# Patient Record
Sex: Female | Born: 1970 | Race: White | Hispanic: No | Marital: Single | Smoking: Never smoker
Health system: Southern US, Community
[De-identification: ages and names within clinical notes are randomized; demographics above are authoritative.]

## PROBLEM LIST (undated history)

## (undated) DIAGNOSIS — T7840XA Allergy, unspecified, initial encounter: Secondary | ICD-10-CM

## (undated) DIAGNOSIS — I341 Nonrheumatic mitral (valve) prolapse: Secondary | ICD-10-CM

## (undated) DIAGNOSIS — I471 Supraventricular tachycardia, unspecified: Secondary | ICD-10-CM

## (undated) DIAGNOSIS — D72829 Elevated white blood cell count, unspecified: Secondary | ICD-10-CM

## (undated) DIAGNOSIS — Z8679 Personal history of other diseases of the circulatory system: Secondary | ICD-10-CM

## (undated) DIAGNOSIS — J302 Other seasonal allergic rhinitis: Secondary | ICD-10-CM

## (undated) DIAGNOSIS — R079 Chest pain, unspecified: Secondary | ICD-10-CM

## (undated) DIAGNOSIS — Z87442 Personal history of urinary calculi: Secondary | ICD-10-CM

## (undated) DIAGNOSIS — T783XXA Angioneurotic edema, initial encounter: Secondary | ICD-10-CM

## (undated) DIAGNOSIS — L509 Urticaria, unspecified: Secondary | ICD-10-CM

## (undated) DIAGNOSIS — N201 Calculus of ureter: Secondary | ICD-10-CM

## (undated) DIAGNOSIS — I10 Essential (primary) hypertension: Secondary | ICD-10-CM

## (undated) DIAGNOSIS — G43909 Migraine, unspecified, not intractable, without status migrainosus: Secondary | ICD-10-CM

## (undated) DIAGNOSIS — K7689 Other specified diseases of liver: Secondary | ICD-10-CM

## (undated) HISTORY — PX: PELVIC LAPAROSCOPY: SHX162

## (undated) HISTORY — DX: Elevated white blood cell count, unspecified: D72.829

## (undated) HISTORY — PX: BREAST LUMPECTOMY: SHX2

## (undated) HISTORY — DX: Supraventricular tachycardia, unspecified: I47.10

## (undated) HISTORY — DX: Other specified diseases of liver: K76.89

## (undated) HISTORY — DX: Angioneurotic edema, initial encounter: T78.3XXA

## (undated) HISTORY — DX: Urticaria, unspecified: L50.9

## (undated) HISTORY — DX: Allergy, unspecified, initial encounter: T78.40XA

## (undated) HISTORY — PX: EXCISION OF BREAST BIOPSY: SHX5822

## (undated) HISTORY — DX: Nonrheumatic mitral (valve) prolapse: I34.1

## (undated) HISTORY — PX: WRIST SURGERY: SHX841

## (undated) HISTORY — DX: Supraventricular tachycardia: I47.1

## (undated) HISTORY — DX: Chest pain, unspecified: R07.9

---

## 1998-06-23 ENCOUNTER — Other Ambulatory Visit: Admission: RE | Admit: 1998-06-23 | Discharge: 1998-06-23 | Payer: Self-pay | Admitting: Obstetrics and Gynecology

## 1999-06-24 ENCOUNTER — Other Ambulatory Visit: Admission: RE | Admit: 1999-06-24 | Discharge: 1999-06-24 | Payer: Self-pay | Admitting: Obstetrics and Gynecology

## 2000-07-01 ENCOUNTER — Encounter: Admission: RE | Admit: 2000-07-01 | Discharge: 2000-07-01 | Payer: Self-pay | Admitting: Obstetrics and Gynecology

## 2000-07-21 ENCOUNTER — Ambulatory Visit (HOSPITAL_BASED_OUTPATIENT_CLINIC_OR_DEPARTMENT_OTHER): Admission: RE | Admit: 2000-07-21 | Discharge: 2000-07-21 | Payer: Self-pay | Admitting: Surgery

## 2000-07-21 ENCOUNTER — Encounter (INDEPENDENT_AMBULATORY_CARE_PROVIDER_SITE_OTHER): Payer: Self-pay | Admitting: *Deleted

## 2000-08-01 ENCOUNTER — Other Ambulatory Visit: Admission: RE | Admit: 2000-08-01 | Discharge: 2000-08-01 | Payer: Self-pay | Admitting: Obstetrics and Gynecology

## 2000-09-08 ENCOUNTER — Encounter: Payer: Self-pay | Admitting: Family Medicine

## 2000-09-08 ENCOUNTER — Encounter: Admission: RE | Admit: 2000-09-08 | Discharge: 2000-09-08 | Payer: Self-pay | Admitting: Family Medicine

## 2001-08-21 ENCOUNTER — Other Ambulatory Visit: Admission: RE | Admit: 2001-08-21 | Discharge: 2001-08-21 | Payer: Self-pay | Admitting: Obstetrics and Gynecology

## 2001-11-21 ENCOUNTER — Emergency Department (HOSPITAL_COMMUNITY): Admission: EM | Admit: 2001-11-21 | Discharge: 2001-11-21 | Payer: Self-pay | Admitting: Emergency Medicine

## 2001-11-21 ENCOUNTER — Encounter: Payer: Self-pay | Admitting: Emergency Medicine

## 2002-09-26 ENCOUNTER — Other Ambulatory Visit: Admission: RE | Admit: 2002-09-26 | Discharge: 2002-09-26 | Payer: Self-pay | Admitting: Obstetrics and Gynecology

## 2003-09-30 ENCOUNTER — Other Ambulatory Visit: Admission: RE | Admit: 2003-09-30 | Discharge: 2003-09-30 | Payer: Self-pay | Admitting: Obstetrics and Gynecology

## 2004-06-08 ENCOUNTER — Ambulatory Visit (HOSPITAL_COMMUNITY): Admission: RE | Admit: 2004-06-08 | Discharge: 2004-06-08 | Payer: Self-pay | Admitting: Family Medicine

## 2004-06-11 ENCOUNTER — Inpatient Hospital Stay (HOSPITAL_COMMUNITY): Admission: EM | Admit: 2004-06-11 | Discharge: 2004-06-16 | Payer: Self-pay | Admitting: Internal Medicine

## 2004-09-24 ENCOUNTER — Ambulatory Visit (HOSPITAL_COMMUNITY): Admission: RE | Admit: 2004-09-24 | Discharge: 2004-09-24 | Payer: Self-pay | Admitting: Family Medicine

## 2004-10-14 ENCOUNTER — Other Ambulatory Visit: Admission: RE | Admit: 2004-10-14 | Discharge: 2004-10-14 | Payer: Self-pay | Admitting: Obstetrics and Gynecology

## 2005-04-06 ENCOUNTER — Encounter: Admission: RE | Admit: 2005-04-06 | Discharge: 2005-04-06 | Payer: Self-pay | Admitting: Allergy and Immunology

## 2005-06-04 ENCOUNTER — Emergency Department (HOSPITAL_COMMUNITY): Admission: EM | Admit: 2005-06-04 | Discharge: 2005-06-04 | Payer: Self-pay | Admitting: *Deleted

## 2005-12-02 ENCOUNTER — Other Ambulatory Visit: Admission: RE | Admit: 2005-12-02 | Discharge: 2005-12-02 | Payer: Self-pay | Admitting: Obstetrics and Gynecology

## 2006-08-18 ENCOUNTER — Emergency Department (HOSPITAL_COMMUNITY): Admission: EM | Admit: 2006-08-18 | Discharge: 2006-08-18 | Payer: Self-pay | Admitting: Emergency Medicine

## 2006-09-13 ENCOUNTER — Ambulatory Visit: Payer: Self-pay | Admitting: Cardiovascular Disease

## 2006-09-19 ENCOUNTER — Ambulatory Visit: Payer: Self-pay | Admitting: Cardiovascular Disease

## 2006-10-13 ENCOUNTER — Ambulatory Visit: Payer: Self-pay | Admitting: Internal Medicine

## 2006-10-14 ENCOUNTER — Ambulatory Visit (HOSPITAL_COMMUNITY): Admission: RE | Admit: 2006-10-14 | Discharge: 2006-10-14 | Payer: Self-pay | Admitting: Cardiovascular Disease

## 2006-10-25 ENCOUNTER — Ambulatory Visit: Payer: Self-pay | Admitting: Cardiovascular Disease

## 2006-11-04 ENCOUNTER — Ambulatory Visit: Payer: Self-pay | Admitting: Internal Medicine

## 2007-01-26 ENCOUNTER — Ambulatory Visit: Payer: Self-pay | Admitting: Internal Medicine

## 2007-01-26 LAB — CONVERTED CEMR LAB: TSH: 0.96 microintl units/mL (ref 0.35–5.50)

## 2007-02-02 ENCOUNTER — Ambulatory Visit: Payer: Self-pay | Admitting: Internal Medicine

## 2007-04-19 ENCOUNTER — Encounter: Admission: RE | Admit: 2007-04-19 | Discharge: 2007-05-18 | Payer: Self-pay | Admitting: Family Medicine

## 2007-08-09 ENCOUNTER — Ambulatory Visit: Payer: Self-pay | Admitting: Internal Medicine

## 2008-07-03 ENCOUNTER — Emergency Department (HOSPITAL_BASED_OUTPATIENT_CLINIC_OR_DEPARTMENT_OTHER): Admission: EM | Admit: 2008-07-03 | Discharge: 2008-07-04 | Payer: Self-pay | Admitting: Emergency Medicine

## 2010-10-11 HISTORY — PX: WRIST SURGERY: SHX841

## 2011-02-23 NOTE — Assessment & Plan Note (Signed)
[facility]                         ELECTROPHYSIOLOGY OFFICE NOTE   NAME:[name] [name], [name] [name]                      MRN:          [id number]  DATE:08/09/2007                            DOB:          08/28/1971    [name] [name] returns today for followup.  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  I saw her last back in  April, and at that time she had not been tolerant of Zoloft, and we  stopped that medication.  She had done well through the spring and  summer, and actually had been out west with her family hiking for weeks  at a time in [place] in the mountains there.  The patient, however,  on returning back to [place], did notice some difficulty with  dizziness and lightheadedness, and she thought the humidity may have  been part of her problem.  She has subsequently gone back to teaching  school, and has gone to exercising 5 days a week with a trainer.  She  states that she still occasionally gets dizzy, and notes that her heart  rate increases to 50 beats per minute during exercise with a treadmill.  She also notes some relative hypotension with blood pressures in the 120  range during exercise.  Otherwise, she feels well, and is generally  better.   EXAMINATION:  She is a pleasant well-appearing young woman in no acute  distress.  The blood pressure today was 120/90.  The pulse was 81 and regular.  Respirations were 18.  The weight was 192 pounds.  NECK:  Revealed no jugular venous distention.  LUNGS:  Clear bilaterally to auscultation.  No wheezes, rales, or  rhonchi were present.  CARDIOVASCULAR EXAM:  Revealed a regular rate and rhythm and normal S1  and S2.  EXTREMITIES:  Demonstrated no edema.   MEDICATIONS:  Include Zyrtec and oral contraceptive pills.   IMPRESSION:  1. History of autonomic dysfunction with syncope, now mostly  resolved.  2. History of palpitations.   DISCUSSION:  Overall, [name] [name] is stable.  She is maintaining  adequate blood pressure with increased fluid and sodium diet.  We will  plan to see her back in the office in 1 year, sooner if additional  problems.     Doylene Canning. Ladona Ridgel, MD  Electronically Signed    GWT/MedQ  DD: 08/09/2007  DT: 08/10/2007  Job #: [id number]

## 2011-02-26 NOTE — Consult Note (Signed)
NAME:  [name] [name], [name] [name]                         ACCOUNT NO.:  [id number]   MEDICAL RECORD NO.:  [id number]                   PATIENT TYPE:  INP   LOCATION:  0359                                 FACILITY:  [facility]   PHYSICIAN:  Danice Goltz, M.D. [facility]            DATE OF BIRTH:  05-Oct-1971   DATE OF CONSULTATION:  06/12/2004  DATE OF DISCHARGE:                                   CONSULTATION   REASON FOR CONSULTATION:  Dyspnea and hypoxia with bilateral pulmonary  infiltrates.   This is a 40 year old white female, nonsmoker, who presented on June 11, 2004, to the emergency room via her primary physician's office because of  dyspnea and hypoxemia. The patient was noted to have bilateral infiltrates.  She had been treated as an outpatient for presumed pneumonia with Levaquin  of which she had had a total of three days. She was subsequently admitted  and a CT scan of the chest showed no pulmonary embolism. However, she had  bilateral infiltrates in a nodular sort of pattern. The infiltrates appear  mostly inflammatory appear mostly inflammatory or consistent with an  alveolitis. The patient does report fevers and chills, temperature up to 101  on several occasions prior to her admission. This was approximately three to  four days prior to her admission. She has had a dry nonproductive cough and  on one occasion was to bring up some mildly yellow sputum, but for the most  part has been nonproductive. She states that she was noted to be tachycardic  at her primary care physician's office where an EKG was done and was  reported normal with the exception of a fast rate. The patient states that  she feels fine as long as she is wearing her oxygen. She appears clearly  dyspneic, though on examination. She denies any chest pain. She has had no  GI symptoms. She denies any arthralgias, myalgias, or any constitutional  symptoms. She denies any recent weight loss and has had no anorexia. There  has been no genitourinary symptoms.   PAST MEDICAL HISTORY:  1.  Endometriosis for which she is on birth control pills.  2.  Seasonal allergic rhinitis for which she takes Zyrtec.   MEDICATIONS:  As noted on the [facility] and currently include Rocephin and  Zithromax.   SOCIAL HISTORY:  She denies cigarette use. She does not imbibe alcohol to  excess. She denies any illicit drug use or HIV risk factors. 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   FAMILY HISTORY:  Noncontributory.   REVIEW OF SYSTEMS:  As noted above and taken extensively, and all parameters  are negative with the exception of her respiratory symptoms as noted above.   PHYSICAL EXAMINATION:  VITAL SIGNS: Blood pressure 115/60, heart rate 101,  respirations 20 at the time of the evaluation, and temperature 99.4.  GENERAL: This is a tall, slender white female who does  not appear to be in  any acute respiratory distress; however she can be noted to occasionally  appear somewhat dyspneic particularly while speaking.  HEENT: Some mild dryness of the oral mucosa. Pharynx is clear without  erythema.  NECK: Supple. Her cervical lymph chain is without swelling or tenderness.  Thyroid is palpable, but within normal limits. Trachea is midline and  nontender.  LUNGS: She has diminished breath sounds at the bases with crackles and pops  heard. She has occasional end-expiratory localized wheeze at the bases.  CARDIAC: Tachycardic, regular rate. No rubs, murmurs, or gallops heard.  ABDOMEN: Soft and nontender. No hepatosplenomegaly noted. No bruits.  Normoactive bowel sounds present.  GU/RECTAL EXAM: Deferred as not pertaining to this acute problem.  EXTREMITIES: The patient has no clubbing, cyanosis, or edema noted.   I did review the patient's CT scan and chest x-ray. She has diffuse  bilateral infiltrates mostly at the bases with extension into the mid lung  fields. She has some perihilar nodular type appearing  infiltrates, and the  appearance of alveolitis on CT scan.   Arterial blood gases show a pH of 7.47, PCO2 35, PAO2 64, this was on room  air. Oxygen saturation at that time was 93%.  White blood cell count 8.4,  H&H 14.1 and 41.3 with a platelet count of 264,000. Electrolytes reveal  sodium of 135, potassium 3.9, chloride 99, bicarbonate 28, BUN 8, creatinine  0.8, glucose 122.   IMPRESSION:  1.  Hypoxemic respiratory failure due to bilateral pulmonary infiltrates of      which a differential is vast.  Differential includes infectious be it      typical or atypical pneumonia versus inflammatory such as acute      alveolitis associated with any of  the myriad of inflammatory conditions      to include connective tissue disease induced pulmonary infiltrates and      potentially also bronchiolitis obliterans organizing pneumonia.  2.  History of endometriosis.  3.  Tachycardia. Query tidal volume depletion.   PLAN:  1.  I would recommend the patient have her IV fluids increase until her p.o.      intake improves.  2.  Continue Rocephin and azithromycin as you are now doing.  3.  Sed rate has already been obtained. Will add LDH, ANA, and rheumatoid      factor to those laboratories as well as TSH and T4 given the patient's      tachycardia.  4.  Obtain sputum for culture and sensitivity, and pneumocystis carina      stain.  5.  For mucociliary clearance, will add low dose Xopenex via nebulizer      followed by an acapella flutter valve to see if this will help her      mobilize secretions.  6.  Because she does have evidence of inflammation with mild bronchospasm      noted on chest examination, I will start empiric IV steroids as she is      covered with IV antibiotics at present.  7.  For completeness will obtain a urine/drug screen though the patient      appears to be very reliable in her history of no drug use; however, must     consider inflammatory lung disease due to illicit  drug use.  8.  Further recommendations pending results of the above. I did discuss with      the patient potential need for bronchoscopy if she fails to improve.  Thank you very much for this consultation.  I will follow along with you  expectantly.                                               Danice Goltz, M.D. [facility]    LG/MEDQ  D:  06/12/2004  T:  06/13/2004  Job:  [id number]

## 2011-02-26 NOTE — Discharge Summary (Signed)
NAME:  NEAL, ROMANCHUK                         ACCOUNT NO.:  [id number]   MEDICAL RECORD NO.:  [id number]                   PATIENT TYPE:  INP   LOCATION:  0359                                 [facility]ACILITY[facility]  [facility]   PHYSICIAN:  Hollice Espy, M.D.            DATE OF BIRTH:  1970-12-16   DATE OF ADMISSION:  06/11/2004  DATE OF DISCHARGE:  06/16/2004                                 DISCHARGE SUMMARY   CONSULTATIONS:  Charlaine Dalton. Sherene Sires, M.D. [facility], pulmonary   PRIMARY CARE PHYSICIAN:  Meredith Staggers, M.D.   DISCHARGE DIAGNOSIS:  1.  Pneumonitis of unspecified etiology.  2.  Endometriosis.  3.  History of seasonal rhinitis.   DISCHARGE MEDICATIONS:  Ketek 400 mg 1 p.o. q.24h. x 4 days, prednisone  taper 40 mg p.o. b.i.d. to continue for decreasing by 10 mg every two days  until a complete ten day course has been done.  The patient will also  continue her previous medications of oral contraceptives and Claritin for  allergies.   HOSPITAL COURSE:  The patient is a 39 year old white female with past  medical history of endometriosis and seasonal rhinitis who presented to the  emergency room  on June 11, 2004, complaining of dyspnea and  polycythemia.  She was noted to have bilateral infiltrates on chest x-ray in  the PCPs office and she had been treated for an outpatient pneumonia and was  admitted under the diagnosis of atypical pneumonia without much improvement  in the outpatient setting.  There was some concern because on ABG on  admission, the patient had noted hypoxemia with a pO2 in the 60s.  A D-dimer  was ordered which was elevated and there was some concerns she may have a  pulmonary embolus.  A CT scan of the chest was ordered on the evening of her  admission which showed no evidence of any pulmonary embolism, however, she  was noted to have bilateral infiltrates in a nodular pattern mostly  inflammatory, possibly an alveolitis.  Given that this may represent  interstitial lung disease, pulmonary care medicine was consulted.  Dr.  Jayme Cloud from the pulmonary group saw the patient and felt that a hypoxemic  respiratory failure with bilateral pulmonary infiltrates has a large  differential diagnoses including infectious versus inflammatory, and  possibly even bronchiolitis obliterans or organizing pneumonia.  The patient  was recommended to continue on her antibiotic.  The sed rate was obtained  which was found to be normal and LDH, ANA, and rheumatoid factor were  ordered, as well.  Empiric steroids were added to her IV antibiotics.  By  June 13, 2004, the patient's chest x-ray done in follow up showed some  improvement.  She, herself, started feeling better.  Her O2 sats remained  approximately 90% on room air by June 15, 2004, and her breathing was  much improved.  By June 16, 2004, she was felt to be  much improved and  stable for discharge.  Her breathing was at her baseline.  An O2 sat was  checked on room air and was 98%, she showed no active work of breathing.  It  was felt that an infection was doubtful and the patient likely had an  unspecified pneumonitis.  It was felt that she should continue on her  current treatment of antibiotics for a full ten more days with a tapering  dose of prednisone.  In addition, prophylactically, she would continue on  four more days of antibiotic Ketac.  The plan will be to discharge the  patient to home.  She will follow up with Dr. Sherene Sires in the pulmonary office  on September 12, she is to  call for appointment as well as directions.  She will, additionally, follow  up with her PCP, Dr. Dayton Scrape, in the next 2-3 weeks.  Her diet is regular.  Activities are as tolerated.  She may return to work only after following up  with Dr. Sherene Sires.  She is, otherwise, being discharged to home and her  disposition is improved.                                                Hollice Espy, M.D.     SKK/MEDQ  D:  06/16/2004  T:  06/16/2004  Job:  [id number]   cc:   Charlaine Dalton. Sherene Sires, M.D. [facility]   Meredith Staggers, M.D.  [address]  [place]  [place] [address]  Fax: [phone]

## 2011-02-26 NOTE — Assessment & Plan Note (Signed)
[facility]                         ELECTROPHYSIOLOGY OFFICE NOTE   NAME:Phillippi, DERRICK EPPERS                      MRN:          [id number]  DATE:11/04/2006                            DOB:          26-Feb-1971    Ms. Medeiros is referred today by Dr. Charlton Haws for evaluation of  possible autonomic dysfunction.   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  The patient was noted to have a history of a very  trace amount of mitral regurgitation and preserved LV function by echo  in 2005.  She subsequently wore a cardiac monitor which demonstrated  heart rates up to 140 beats per minute thought to be secondary to sinus  tachycardia and this heart racing was associated with dyspnea.  She  underwent cardiopulmonary stress testing where her heart rate increased  up to 146 beats per minute despite a very small (2.5 a minute) work  load.  Her blood pressure response dropped during exercise manifesting a  very abnormal blood pressure response.  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  The patient was subsequently given a  prescription for Zoloft 25 mg daily and is referred today for additional  evaluation.  She denies any frank syncope.  She does have occasional  dizzy spells and when she does, she sits down or lies down and the  episodes typically pass without other sequelae.  She denies chest pain  or shortness of breath.  She denies nausea, vomiting, diarrhea or  constipation.  She has no difficulty swallowing.   PAST MEDICAL HISTORY:  Notable for a lumpectomy in the past.  She has a  history of laparoscopy in the past, as well.   SOCIAL HISTORY:  The patient is single and teaches third grade.  She  denies tobacco and ethanol abuse.  She notes that she exercises several  times per week but not in a strenuous manner.   She drinks less than one  caffeinated beverage a day.   MEDICATIONS:  Oral contraceptives and Zyrtec p.r.n.   REVIEW OF SYSTEMS:  Otherwise negative except as noted in the HPI.   PHYSICAL EXAMINATION:  GENERAL:  Pleasant, well appearing young woman looking somewhat younger  than her stated age.  VITAL SIGNS:  Blood pressure 131/85 in the supine position and 86 was  her heart rate.  Standing, blood pressure actually increased to 155/88  and her heart rate increased to 103 beats per minute.  There was no  significant change at standing at five minutes.  Respirations were 18.  Weight 190 pounds.  HEENT:  Normocephalic, atraumatic, pupils equal and round, oropharynx  moist, sclerae anicteric.  NECK:  No jugular venous distention. There is no thyromegaly.  The  trachea was midline. The carotids are 2+ and symmetric.  LUNGS:  Clear to auscultation bilaterally.  No wheezes, rales, or  rhonchi.  HEART:  Regular rate and rhythm with normal S1 and S2.  There are no  murmurs, gallops, and  rubs.  The PMI is not enlarged nor laterally  displaced.  ABDOMEN:  Soft, nontender, nondistended.  There is no organomegaly.  EXTREMITIES:  No cyanosis, clubbing, or edema. Pulses were 2+ and  symmetric.  NEUROLOGICAL:  Alert and oriented x3, cranial nerves intact, strength  5/5 and symmetric.   Her EKG demonstrates sinus rhythm with normal axis and intervals.   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   DISCUSSION:  The patient's symptoms are fairly mild, though she does  have some exercise intolerance.  She has had no frank syncope.  I have  recommended that she continue on her Zoloft and increase the dose of 50  mg daily as this is our typical low therapeutic dose.  I plan to see her  back in approximately 6-8 weeks to see how she is doing.  I have talked  to her about the importance of avoiding caffeinated beverages and of  getting plenty of rest and avoiding other stimulants.  Also,  avoiding  alcoholic beverages has been discussed.  She has been instructed to  increase her salt and fluid intake.  I have asked that she continue to  exercise on a regular basis.     Doylene Canning. Ladona Ridgel, MD  Electronically Signed    GWT/MedQ  DD: 11/04/2006  DT: 11/05/2006  Job #: [id number]   cc:   Noralyn Pick. Eden Emms, MD, Raliegh Scarlet, MD

## 2011-02-26 NOTE — Assessment & Plan Note (Signed)
[facility]                         ELECTROPHYSIOLOGY OFFICE NOTE   NAME:[name], [name] FLAIM                      MRN:          [id number]  DATE:02/02/2007                            DOB:          [birth year]    HISTORY OF PRESENT ILLNESS:  [name] [name] returns today for followup. She  is a very pleasant young woman with a history of presumed autonomic  dysfunction, which in retrospect, I suspect is more likely related to  deconditioning, who returns today for followup. I saw her back in  January and at that time, she had been placed on low dose Zoloft by Dr.  Eden Emms. Because of her very small dose, we actually increased it to 50  mg daily. She notes that she has not done particularly better. She has  some trouble sleeping and does have some bazaar dreams. Her activity is  fairly sedentary. She exercises 3 days a week at [employer] but does not  note any appreciable improvement in her exercise tolerance or in her  fatigue. The patient has otherwise been fairly stable. 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   MEDICATIONS:  Include Zoloft 50 daily, Zyrtec, and oral contraceptive  agents.   PHYSICAL EXAMINATION:  GENERAL:  A pleasant well appearing woman in no  distress.  VITAL SIGNS:  Blood pressure 120/78, pulse 92 and regular. Respiratory  rate 18. Weight 188 pounds.  NECK:  No jugular venous distention.  LUNGS:  Clear to auscultation bilaterally. No wheezes, rales, or rhonchi  are present.  CARDIOVASCULAR:  Regular rate and rhythm. Normal S1 and S2.  EXTREMITIES:  No edema.   IMPRESSION:  1. Questionable autonomic dysfunction versus deconditioning.  2. Palpitations.   DISCUSSION:  [name] [name] is otherwise stable. Because she has had no  improvement on the Zoloft, I have asked that she wean herself off of  this by taking 25 mg daily for 2 weeks, then stopping it altogether. I  have asked that she increase her exercise  regimen by walking or doing  some aerobic exercise, at least 30 minutes every day. I would like to  see her back one time near the end of the summer, in the middle of  August. At that point, if she is stable, then I may allow her to  followup with her primary care physician.     Doylene Canning. Ladona Ridgel, MD  Electronically Signed    GWT/MedQ  DD: 02/02/2007  DT: 02/02/2007  Job #: [id number]

## 2011-02-26 NOTE — Assessment & Plan Note (Signed)
[facility]                            CARDIOLOGY OFFICE NOTE   NAME:[name] [name]                      MRN:          [id number]  DATE:10/25/2006                            DOB:          January 29, 1971    [name] [name] returns today for followup.  I initially saw her on September 13, 2006.  At that time, she was referred for exertional dyspnea,  palpitations and shortness of breath.   In talking to the patient, she did not appear to have a significant  structural heart problem.  She had an echo back in 2005, which was  essentially normal with trace MR.   However, I was somewhat concerned by the patient's autonomic function.  She appeared to have excessive tachycardia with exercise and  lightheadedness that may have represented POT syndrome.   The patient had an event monitor which showed sinus tachycardia with  rates as high as 138 with symptoms of dyspnea.   We scheduled her for a CPX study.  Again with CPX testing, she had  inappropriately high heart rates at low work loads.  This included a  heart rate of 146 at 2.5 met level.   She also had an abnormal blood pressure response; at one point during  exercise, her blood pressure peaked at 175 systolic and then dropped to  657 with symptoms of lightheadedness.   Overall, I think the clinical scenario is fairly classic for POT  syndrome.   I had a long discussion with the patient regarding the diagnosis of  dysautonomia.   She sweats normally and would not appear to have any other difficulties,  although it was interesting that she also appears to have some mild  immunodeficiency that has led to some pulmonary infections in the past.  I told her I would like to refer her to Dr. Graciela Husbands who has a bit of  expertise in this area, and he has also referred some people to Dr.  Berneice Gandy in [place] for further testing.  I gave her initially 2 treatment  options including SSRIs and midodrine.  We both agreed that at a  young  age taking a t.i.d. drug was somewhat difficult.  We will start her on  Zoloft 25 mg a day.  She will followup with Dr. Graciela Husbands to see if any  other testing or medication trials are indicated.   I tried to reassure her that this was not a dangerous condition, but I  suspect it does clinically fit with her symptoms that she has had for  quite some time.   PHYSICAL EXAMINATION:  VITAL SIGNS:  The pulse is 88 and regular, blood  pressure is 120/70 sitting (it actually rises to 140/75 on standing at  rest).  NECK:  There is no lymphadenopathy, no thyromegaly.  Carotids are  normal.  HEART:  There is an S1 and S2 with normal heart sounds.  ABDOMEN:  Benign.  LOWER EXTREMITIES:  Intact pulses, no edema, no varicosities.   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  Start Zoloft 25 mg a day.  The  patient tends to be very  sensitive to any medication.  When she sees Dr.  Graciela Husbands in followup, they can make a decision about whether or not to  increase her Zoloft to 50 mg a day.   She does not want to take a t.i.d. drug such a Proamatine.   We talked about liberalizing salt and water intake.   I am anxious to see if Dr. Graciela Husbands has any other thoughts on her clinical  condition.   I think that the event monitoring and CPX has given [place] an answer in  regards to her inappropriate tachycardia and drop in blood pressure with  exercise.     Noralyn Pick. Eden Emms, MD, [facility]  Electronically Signed    PCN/MedQ  DD: 10/25/2006  DT: 10/26/2006  Job #: [id number]

## 2011-02-26 NOTE — Op Note (Signed)
[place]  Patient:    [name] [name], [name] [name]                        MRN: [id number] Proc. Date: 07/21/00 Adm. Date:  [id number] Attending:  Andre Lefort CC:         Arvella Merles, M.D.  Keane Scrape, M.D.   Operative Report  DATE OF BIRTH:  [birth year]  PREOPERATIVE DIAGNOSIS:  Mass right breast in a ______ position.  POSTOPERATIVE DIAGNOSIS:  Mass right breast in a ______ position.  PROCEDURE:  Right breast biopsy.  SURGEON:  Dr. Ezzard Standing.  ASSISTANT:  None.  ANESTHESIA:  MAC anesthesia with about 20 cc. of 1% Xylocaine.  COMPLICATIONS:  None.  INDICATIONS FOR PROCEDURE:  Ms. 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  The patient now comes for excision of this mass.  The patient was placed in the supine position.  ______ MAC anesthesia right breast was prepped with Betadine solution and sterilely draped.  An incision was made over directly over the palpable mass in her right breast excising an approximate 2 cm mass which appeared to be a fibroadenoma and this was sent for permanent pathology.  The wound was irrigated and closed with 3-0 Vicryl suture and the skin with 5-0 Vicryl suture, painted with tincture of benzoin and Steri-Strips and sterilely dressed.  The patient tolerated the procedure well and was transferred to the recovery room in good condition.  She will see me back in my office in a week for wound check. DD:  07/21/00 TD:  07/22/00 Job: [id number]

## 2011-02-26 NOTE — Assessment & Plan Note (Signed)
[facility]                            CARDIOLOGY OFFICE NOTE   NAME:[name] [name]                      MRN:          [id number]  DATE:09/13/2006                            DOB:          [birth year]    [name] is a 40 year old patient referred for exertional dyspnea,  palpitations, and shortness of breath.   The patient had an episode on November 8, where she suddenly felt short  of breath.  It hurt her to take a deep breath.  She was seen in the  office and found to be tachycardic in sinus at a heart rate of 140.  She  was sent to [facility] and had a normal CT scan.  There was no evidence of PE or interstitial lung disease.   In talking to the patient, she felt that she has had exaggerated heart  rate responses under different stresses.  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  She was admitted to [facility] under the pulmonary service for pneumonitis of unspecified  etiology.  She has a history of presumed asthma with seasonal rhinitis.   She has not had recent follow-up with our lung doctors.   When she was seen at [facility], she had a TSH and T4 which were normal and  her lab work was otherwise unremarkable.  The patient denies excessive  caffeine or drug use.  She has not been diagnosed with anxiety attacks  before.  The patient has had an echo here in our office in October of  2005 which was essentially normal.  There was actually no evidence of  MVP and I actually read the study.   REVIEW OF SYSTEMS:  Remarkable for no productive sputum, no recent  fever, no swelling in the legs, and no chest pain.   FAMILY HISTORY:  Unremarkable.  She has a brother and sister without  significant problems.  She has a grandmother who is morbidly obese and  short of stature who has had heart failure.   SOCIAL  HISTORY:  She is a third grade teacher.  She is not married.  She  enjoys traveling and doing mission trips.   She has had a previous lumpectomy and laparoscopy.  She has no  significant allergies.  She tries to walk on a regular basis and goes to  [employer] three times per week.   MEDICATIONS:  Her only medications include Zyrtec and birth control  pills.   PHYSICAL EXAMINATION:  HEENT:  Normal.  There is no thyromegaly, no  lymphadenopathy, and no carotid bruits.  LUNGS:  Clear.  There is no evidence of wheezing.  HEART:  There is an S1 and S2 with normal heart sounds.  There is no  murmur, rub, gallop, or click.  ABDOMEN:  Benign.  EXTREMITIES:  Lower extremities intact pulses and no edema.  NEUROLOGY:  Nonfocal.  SKIN:  Warm and dry.   EKG is in the chart from [facility] showed essentially normal rhythm with no  arrhythmia and no evidence of congenital abnormalities and no conduction  delays.   IMPRESSION:  The patient would clinically sound like she was having  anxiety attacks.  However, this does not seem to fit her personality.  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  I am  not sure if an alpha trypsin level has been checked, but this may be  worthwhile.   The patient does not need a follow-up echo, hers was normal in 2005 and  she has no abnormal murmurs.   We will give her an event monitor for a month to see if we can capture  any abnormal atrial arrhythmias.  I would also like her to have a CPX  study to see what her exercise limitations are, either cardiac or  pulmonary.  We will see her after this to further investigate her  symptoms, but I suspect that she just has a tendency to have somewhat of  an excessive heart rate with any type of stimulation.   Further recommendations will be based on the results of her CPX and  event monitor.     Noralyn Pick. Eden Emms, MD, [facility]  Electronically Signed    PCN/MedQ  DD: 09/13/2006  DT: 09/14/2006  Job  #: [id number]   cc:   Deatra James, M.D.

## 2011-02-26 NOTE — H&P (Signed)
NAME:  [name] [name], [name] [name]                         ACCOUNT NO.:  [id number]   MEDICAL RECORD NO.:  [id number]                   PATIENT TYPE:  INP   LOCATION:  0359                                 FACILITY:  [facility]   PHYSICIAN:  Hollice Espy, M.D.            DATE OF BIRTH:  20-Jun-1971   DATE OF ADMISSION:  06/11/2004  DATE OF DISCHARGE:                                HISTORY & PHYSICAL   PRIMARY CARE PHYSICIAN:  Dr. Arsenio Loader   CHIEF COMPLAINT:  Shortness of breath.   The patient is a 40 year old white female with past medical history of  endometriosis, who presents for 5 days of increasing shortness of breath.  The patient reportedly has been previously well with no complaints.  She has  not had any previous problems with asthma or pneumonia and starting on  Saturday, she was complaining of some slightly increased shortness of  breath.  Since then, she has noticed that she is having more and more  dyspnea on exertion and occasional cough which has been productive.  She  described the sputum as mostly yellowish.  Her symptoms continued to worsen,  and she initially saw her PCP early in the week and since then has noted  that on a follow up visit today, her O2 saturations were down from 98 to  94%.  She is having much more dyspnea on exertion.  A chest x-ray done  showed bilateral lower lobe pneumonia, and the concern was that patient was  having extensive pneumonia that was not doing well on outpatient antibiotic  therapy.  After discussion with [facility] hospitalist, the patient was  transferred to [facility] as a direct admission.  Currently, she states she  is feeling okay, although she can feel that she is still short of breath.  She denies any chest pain, headaches, visual changes, palpitations,  dysphagia, abdominal pain, wheezing, hematuria, dysuria, constipation, or  diarrhea.   PAST MEDICAL HISTORY:  Endometriosis with seasonal rhinitis.   MEDICATIONS:  1.  Seasonale  which is a birth control pill for her endometriosis.  2.  She is also on allergy medications.   SOCIAL HISTORY:  She denies any tobacco, alcohol, or drug use.   FAMILY HISTORY:  Noncontributory.   PHYSICAL EXAMINATION:  VITAL SIGNS ON ADMISSION:  Temperature 98.3, blood  pressure 132/75, respirations 16, heart rate is elevated at 129.  When I  evaluated the patient, she was borderline tachycardic.  O2 saturation 95% on  room air.  GENERAL:  She alert and oriented x 3.  No apparent distress.  HEENT:  Normocephalic, atraumatic.  Mucous membranes are moist.  She has no  carotid bruits.  HEART:  Regular rate and rhythm.  LUNGS:  She has bibasilar crackles, left greater than right.  ABDOMEN:  Soft, nontender, nondistended, positive bowel sounds.  EXTREMITIES:  No cyanosis, clubbing, or edema.   LABORATORY DATA:  CBC, ABG, and BMET are  ordered and are pending.   ASSESSMENT AND PLAN:  Bilateral lower lobe pneumonia.  Given patient is  tachycardic and feeling slightly short of breath, would like to go ahead and  100% rule out a severe pulmonary embolus or other lung disease.  To that, we  will go ahead and check an ABG first.  If her pO2 is significantly  decreased, I would go ahead with checking a D-dimer and perhaps a chest CT.  In the meantime, likely this is bilateral pneumonia.  We will go ahead with  IV antibiotics, breathing treatments, and supplemental oxygen, although her  O2 saturations are good.  We will continue to follow the patient.                                               Hollice Espy, M.D.    SKK/MEDQ  D:  06/11/2004  T:  06/11/2004  Job:  [id number]   cc:   Meredith Staggers, M.D.  [address], [address]  [place]  [place] [address]  Fax: [phone]

## 2011-07-12 LAB — URINALYSIS, ROUTINE W REFLEX MICROSCOPIC
Specific Gravity, Urine: 1.022
Urobilinogen, UA: 0.2

## 2011-07-12 LAB — DIFFERENTIAL
Basophils Absolute: 0.2 — ABNORMAL HIGH
Lymphocytes Relative: 6 — ABNORMAL LOW
Monocytes Absolute: 0.6
Monocytes Relative: 5
Neutro Abs: 9.3 — ABNORMAL HIGH
Neutrophils Relative %: 86 — ABNORMAL HIGH

## 2011-07-12 LAB — CULTURE, BLOOD (ROUTINE X 2): Culture: NO GROWTH

## 2011-07-12 LAB — URINE CULTURE
Colony Count: NO GROWTH
Culture: NO GROWTH

## 2011-07-12 LAB — URINE MICROSCOPIC-ADD ON

## 2011-07-12 LAB — BASIC METABOLIC PANEL
Calcium: 9.2
GFR calc Af Amer: >60
GFR calc non Af Amer: >60
Sodium: 137

## 2011-07-12 LAB — CBC
Hemoglobin: 14.7
RBC: 4.73
RDW: 12.3

## 2011-09-23 ENCOUNTER — Emergency Department (INDEPENDENT_AMBULATORY_CARE_PROVIDER_SITE_OTHER): Payer: No Typology Code available for payment source

## 2011-09-23 ENCOUNTER — Emergency Department (HOSPITAL_BASED_OUTPATIENT_CLINIC_OR_DEPARTMENT_OTHER)
Admission: EM | Admit: 2011-09-23 | Discharge: 2011-09-23 | Disposition: A | Discharge status: Home / Self Care | Payer: No Typology Code available for payment source | Attending: Emergency Medicine | Admitting: Emergency Medicine

## 2011-09-23 ENCOUNTER — Encounter: Payer: Self-pay | Admitting: *Deleted

## 2011-09-23 DIAGNOSIS — Z043 Encounter for examination and observation following other accident: Secondary | ICD-10-CM

## 2011-09-23 DIAGNOSIS — Q619 Cystic kidney disease, unspecified: Secondary | ICD-10-CM

## 2011-09-23 DIAGNOSIS — M542 Cervicalgia: Secondary | ICD-10-CM

## 2011-09-23 DIAGNOSIS — M546 Pain in thoracic spine: Secondary | ICD-10-CM | POA: Insufficient documentation

## 2011-09-23 DIAGNOSIS — R1031 Right lower quadrant pain: Secondary | ICD-10-CM | POA: Insufficient documentation

## 2011-09-23 DIAGNOSIS — M549 Dorsalgia, unspecified: Secondary | ICD-10-CM

## 2011-09-23 DIAGNOSIS — Y92410 Unspecified street and highway as the place of occurrence of the external cause: Secondary | ICD-10-CM | POA: Insufficient documentation

## 2011-09-23 DIAGNOSIS — R1011 Right upper quadrant pain: Secondary | ICD-10-CM | POA: Insufficient documentation

## 2011-09-23 HISTORY — DX: Other seasonal allergic rhinitis: J30.2

## 2011-09-23 LAB — COMPREHENSIVE METABOLIC PANEL
ALT: 9 U/L (ref 0–35)
AST: 14 U/L (ref 0–37)
Alkaline Phosphatase: 40 U/L (ref 39–117)
CO2: 22 mEq/L (ref 19–32)
Chloride: 104 mEq/L (ref 96–112)
GFR calc Af Amer: >90 mL/min (ref >90)
GFR calc non Af Amer: >90 mL/min (ref >90)
Glucose, Bld: 146 mg/dL — ABNORMAL HIGH (ref 70–99)
Potassium: 4 mEq/L (ref 3.5–5.1)
Sodium: 139 mEq/L (ref 135–145)
Total Bilirubin: 0.4 mg/dL (ref 0.3–1.2)

## 2011-09-23 LAB — URINALYSIS, ROUTINE W REFLEX MICROSCOPIC
Glucose, UA: NEGATIVE mg/dL
Ketones, ur: NEGATIVE mg/dL
Protein, ur: NEGATIVE mg/dL
Urobilinogen, UA: 0.2 mg/dL (ref 0.0–1.0)

## 2011-09-23 LAB — CBC
Hemoglobin: 14.7 g/dL (ref 12.0–15.0)
MCH: 30.3 pg (ref 26.0–34.0)
MCHC: 33.5 g/dL (ref 30.0–36.0)
Platelets: 238 10*3/uL (ref 150–400)
RDW: 13.1 % (ref 11.5–15.5)

## 2011-09-23 LAB — URINE MICROSCOPIC-ADD ON

## 2011-09-23 LAB — PREGNANCY, URINE: Preg Test, Ur: NEGATIVE

## 2011-09-23 MED ORDER — HYDROCODONE-ACETAMINOPHEN 5-325 MG PO TABS
2.0000 | ORAL_TABLET | Freq: Once | ORAL | Status: AC
  Administered 2011-09-23: 2 via ORAL
  Filled 2011-09-23: qty 2

## 2011-09-23 MED ORDER — IBUPROFEN 600 MG PO TABS: 600.0000 mg | ORAL_TABLET | Freq: Four times a day (QID) | ORAL | Status: AC | PRN

## 2011-09-23 MED ORDER — HYDROCODONE-ACETAMINOPHEN 5-325 MG PO TABS: 1.0000 | ORAL_TABLET | ORAL | Status: AC | PRN

## 2011-09-23 MED ORDER — CYCLOBENZAPRINE HCL 10 MG PO TABS: 10.0000 mg | ORAL_TABLET | Freq: Every evening | ORAL | Status: AC | PRN

## 2011-09-23 MED ORDER — IOHEXOL 300 MG/ML  SOLN
100.0000 mL | Freq: Once | INTRAMUSCULAR | Status: AC | PRN
  Administered 2011-09-23: 100 mL via INTRAVENOUS

## 2011-09-23 MED ORDER — SODIUM CHLORIDE 0.9 % IV BOLUS (SEPSIS)
1000.0000 mL | Freq: Once | INTRAVENOUS | Status: AC
  Administered 2011-09-23: 1000 mL via INTRAVENOUS
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
## 2011-09-23 NOTE — ED Notes (Signed)
Pt assisted to amb to bathroom, gait is slow and steady, pt assisted to amb back to room, states that when she wiped she noticed a small amount of blood on the tissue. Pt requests to speak with md prior to discharge, dr.webb aware.

## 2011-09-23 NOTE — ED Notes (Signed)
Pt ambulated to bathroom. C/O gross blood after wiping. She did have a bowel movement at that time but thinks it was from her urine stream. She does have suprapubic ttp. External genitalia unremarkable. No gross blood per rectum- stool card sent to lab-- heme negative. Awaiting U/A/preg. Pain control. Reassess  Forbes Cellar, MD 09/23/11 [phone]
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
## 2011-09-23 NOTE — ED Notes (Signed)
Imaging, labs reviewed and unremarkable for acute changes. Pt c/o "pain all over". Continues to refuse pain medication here, stating "I just want to go home and relax". Will prescribe ibuprofen, flexeril, norco for home. Precautions for return.  Forbes Cellar, MD 09/23/11 [phone]

## 2011-09-23 NOTE — ED Notes (Signed)
Pt with moderate Hgb on U/A. +gross hematuria with wiping. D/W Dr. Lindie Spruce of trauma surgery. No further imaging needed at this time. Advise pt to drink plenty of fluids. If hematuria persists in 48 hours will follow up with trauma surgery OR PMD for recheck. Precautions for return including inability to urinate.  Forbes Cellar, MD 09/23/11 1101

## 2011-09-23 NOTE — ED Notes (Signed)
Patient transported to radiology
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
## 2012-09-15 ENCOUNTER — Other Ambulatory Visit: Payer: Self-pay | Admitting: Orthopedic Surgery

## 2012-09-15 DIAGNOSIS — M6740 Ganglion, unspecified site: Secondary | ICD-10-CM

## 2013-05-30 ENCOUNTER — Other Ambulatory Visit (HOSPITAL_BASED_OUTPATIENT_CLINIC_OR_DEPARTMENT_OTHER): Payer: Self-pay | Admitting: Family Medicine

## 2013-05-30 DIAGNOSIS — M542 Cervicalgia: Secondary | ICD-10-CM

## 2013-06-02 ENCOUNTER — Ambulatory Visit (HOSPITAL_BASED_OUTPATIENT_CLINIC_OR_DEPARTMENT_OTHER)
Admission: RE | Admit: 2013-06-02 | Discharge: 2013-06-02 | Disposition: A | Discharge status: Home / Self Care | Payer: BC Managed Care – PPO | Source: Ambulatory Visit | Attending: Family Medicine | Admitting: Family Medicine

## 2013-06-02 DIAGNOSIS — M47812 Spondylosis without myelopathy or radiculopathy, cervical region: Secondary | ICD-10-CM | POA: Insufficient documentation

## 2013-06-02 DIAGNOSIS — M542 Cervicalgia: Secondary | ICD-10-CM

## 2014-12-30 ENCOUNTER — Other Ambulatory Visit: Payer: Self-pay | Admitting: Obstetrics and Gynecology

## 2014-12-31 LAB — CYTOLOGY - PAP

## 2015-05-24 ENCOUNTER — Encounter (HOSPITAL_COMMUNITY): Payer: Self-pay | Admitting: *Deleted

## 2015-05-24 ENCOUNTER — Emergency Department (HOSPITAL_COMMUNITY)
Admission: EM | Admit: 2015-05-24 | Discharge: 2015-05-24 | Disposition: A | Discharge status: Home / Self Care | Payer: BC Managed Care – PPO | Attending: Emergency Medicine | Admitting: Emergency Medicine

## 2015-05-24 DIAGNOSIS — Z79899 Other long term (current) drug therapy: Secondary | ICD-10-CM | POA: Insufficient documentation

## 2015-05-24 DIAGNOSIS — I471 Supraventricular tachycardia: Secondary | ICD-10-CM | POA: Insufficient documentation

## 2015-05-24 DIAGNOSIS — R002 Palpitations: Secondary | ICD-10-CM | POA: Diagnosis present

## 2015-05-24 DIAGNOSIS — Z8742 Personal history of other diseases of the female genital tract: Secondary | ICD-10-CM | POA: Insufficient documentation

## 2015-05-24 HISTORY — DX: Supraventricular tachycardia: I47.1

## 2015-05-24 LAB — COMPREHENSIVE METABOLIC PANEL
ALK PHOS: 37 U/L — AB (ref 38–126)
ALT: 16 U/L (ref 14–54)
AST: 28 U/L (ref 15–41)
Albumin: 4.1 g/dL (ref 3.5–5.0)
Anion gap: 13 (ref 5–15)
BILIRUBIN TOTAL: 0.3 mg/dL (ref 0.3–1.2)
BUN: 12 mg/dL (ref 6–20)
CALCIUM: 8.8 mg/dL — AB (ref 8.9–10.3)
CO2: 20 mmol/L — AB (ref 22–32)
CREATININE: 0.83 mg/dL (ref 0.44–1.00)
Chloride: 107 mmol/L (ref 101–111)
GLUCOSE: 90 mg/dL (ref 65–99)
Potassium: 4.6 mmol/L (ref 3.5–5.1)
Sodium: 140 mmol/L (ref 135–145)
TOTAL PROTEIN: 7 g/dL (ref 6.5–8.1)

## 2015-05-24 LAB — CBC
HCT: 49.2 % — ABNORMAL HIGH (ref 36.0–46.0)
HEMOGLOBIN: 16.2 g/dL — AB (ref 12.0–15.0)
MCH: 31.1 pg (ref 26.0–34.0)
MCHC: 32.9 g/dL (ref 30.0–36.0)
MCV: 94.4 fL (ref 78.0–100.0)
PLATELETS: 270 10*3/uL (ref 150–400)
RBC: 5.21 MIL/uL — ABNORMAL HIGH (ref 3.87–5.11)
RDW: 13.1 % (ref 11.5–15.5)
WBC: 13.6 10*3/uL — ABNORMAL HIGH (ref 4.0–10.5)

## 2015-05-24 LAB — T4, FREE: FREE T4: 0.82 ng/dL (ref 0.61–1.12)

## 2015-05-24 MED ORDER — GI COCKTAIL ~~LOC~~: 30.0000 mL | Freq: Once | ORAL | Status: DC

## 2015-05-24 MED ORDER — HYDROCODONE-ACETAMINOPHEN 5-325 MG PO TABS: 1.0000 | ORAL_TABLET | Freq: Once | ORAL | Status: DC

## 2015-05-24 MED ORDER — FAMOTIDINE 20 MG PO TABS: 20.0000 mg | ORAL_TABLET | Freq: Once | ORAL | Status: DC

## 2015-05-24 NOTE — ED Provider Notes (Signed)
CSN: [id number]     Arrival date & time 05/24/15  1143 History   First MD Initiated Contact with Patient 05/24/15 1155     Chief Complaint  Patient presents with  . Tachycardia    svt     (Consider location/radiation/quality/duration/timing/severity/associated sxs/prior Treatment) The history is provided by the patient.  patient w onset palpitations early this AM. Went to bed last night, felt normal.  Awoke 3 am and noticed rapid/racing heartbeat. Had brief, similar episode yrs ago, not seen then, no prior dx svt, or dysrhythmia. Denies any recent heavy etoh. No hx heavy caffeine use. No recent fevers/febrile illness. No fainting/syncope. No chest pain or sob. No recent blood loss. No hx thyroid. No recent wt loss or heat intolerance or sweating.       Past Medical History  Diagnosis Date  . Seasonal allergies   . Endometriosis   . SVT (supraventricular tachycardia)    Past Surgical History  Procedure Laterality Date  . Breast lumpectomy Left   . Wrist surgery Left     cyst removal   No family history on file. Social History  Substance Use Topics  . Smoking status: Never Smoker   . Smokeless tobacco: None  . Alcohol Use: Yes     Comment: occ   OB History    No data available     Review of Systems  Constitutional: Negative for fever and chills.  HENT: Negative for sore throat.   Eyes: Negative for redness.  Respiratory: Negative for shortness of breath.   Cardiovascular: Positive for palpitations. Negative for chest pain and leg swelling.  Gastrointestinal: Negative for vomiting, abdominal pain and diarrhea.  Genitourinary: Negative for flank pain.  Musculoskeletal: Negative for back pain and neck pain.  Skin: Negative for rash.  Neurological: Negative for syncope and headaches.  Hematological: Does not bruise/bleed easily.  Psychiatric/Behavioral: Negative for confusion.      Allergies  Sulfa antibiotics  Home Medications   Prior to Admission  medications   Medication Sig Start Date End Date Taking? Authorizing Provider  cetirizine (ZYRTEC) 10 MG tablet Take 10 mg by mouth daily.      Historical Provider, MD  levonorgestrel-ethinyl estradiol (SEASONALE,INTROVALE,JOLESSA) 0.15-0.03 MG tablet Take 1 tablet by mouth daily.      Historical Provider, MD   BP 124/91 mmHg  Pulse 101  Temp(Src) 98.3 F (36.8 C) (Oral)  Resp 18  Ht 5' 11.75" (1.822 m)  Wt 200 lb (90.719 kg)  BMI 27.33 kg/m2  SpO2 99%  LMP 04/23/2015 Physical Exam  Constitutional: She is oriented to person, place, and time. She appears well-developed and well-nourished. No distress.  HENT:  Mouth/Throat: Oropharynx is clear and moist.  Eyes: Conjunctivae are normal. No scleral icterus.  Neck: Neck supple. No tracheal deviation present. No thyromegaly present.  Cardiovascular: Normal rate, regular rhythm, normal heart sounds and intact distal pulses.  Exam reveals no gallop and no friction rub.   No murmur heard. Pulmonary/Chest: Effort normal and breath sounds normal. No respiratory distress.  Abdominal: Soft. Normal appearance and bowel sounds are normal. She exhibits no distension. There is no tenderness.  Musculoskeletal: She exhibits no edema or tenderness.  Neurological: She is alert and oriented to person, place, and time.  Skin: Skin is warm and dry. No rash noted. She is not diaphoretic.  Psychiatric: She has a normal mood and affect.  Nursing note and vitals reviewed.   ED Course  Procedures (including critical care time) Labs Review  Results for  orders placed or performed during the hospital encounter of 05/24/15  CBC  Result Value Ref Range   WBC 13.6 (H) 4.0 - 10.5 K/uL   RBC 5.21 (H) 3.87 - 5.11 MIL/uL   Hemoglobin 16.2 (H) 12.0 - 15.0 g/dL   HCT 49.2 (H) 36.0 - 46.0 %   MCV 94.4 78.0 - 100.0 fL   MCH 31.1 26.0 - 34.0 pg   MCHC 32.9 30.0 - 36.0 g/dL   RDW 13.1 11.5 - 15.5 %   Platelets 270 150 - 400 K/uL  Comprehensive metabolic panel   Result Value Ref Range   Sodium 140 135 - 145 mmol/L   Potassium 4.6 3.5 - 5.1 mmol/L   Chloride 107 101 - 111 mmol/L   CO2 20 (L) 22 - 32 mmol/L   Glucose, Bld 90 65 - 99 mg/dL   BUN 12 6 - 20 mg/dL   Creatinine, Ser 0.83 0.44 - 1.00 mg/dL   Calcium 8.8 (L) 8.9 - 10.3 mg/dL   Total Protein 7.0 6.5 - 8.1 g/dL   Albumin 4.1 3.5 - 5.0 g/dL   AST 28 15 - 41 U/L   ALT 16 14 - 54 U/L   Alkaline Phosphatase 37 (L) 38 - 126 U/L   Total Bilirubin 0.3 0.3 - 1.2 mg/dL   GFR calc non Af Amer >60 >60 mL/min   GFR calc Af Amer >60 >60 mL/min   Anion gap 13 5 - 15  T4, free  Result Value Ref Range   Free T4 0.82 0.61 - 1.12 ng/dL      I, Jaliel Deavers E, personally reviewed and evaluated these images and lab results as part of my medical decision-making.   EKG Interpretation   Date/Time:  Saturday May 24 2015 11:50:03 EDT Ventricular Rate:  98 PR Interval:  133 QRS Duration: 78 QT Interval:  322 QTC Calculation: 411 R Axis:   76 Text Interpretation:  Sinus rhythm No significant change since last  tracing Confirmed by Ailie Gage  MD, Lennette Bihari ([id number]) on 05/24/2015 12:08:40 PM      MDM   Iv ns. Monitor. Labs.  Reviewed nursing notes and prior charts for additional history.   From [facility] (where by went initially), ecg w narrow complex tachy, svt, rate 190.  In ED, remains in nsr. No cp or discomfort. No sob.   Patient remains in sinus, no cp or sob.  Pt currently appears stable for d/c.       Lajean Saver, MD 05/24/15 1453

## 2015-05-24 NOTE — ED Notes (Signed)
Pt woke up at 3 am with chest pressure and a rapid hr.  She thought she had heartburn so she went to [facility].  Her HR was 192.  Pt converted on her own and hr now 103.

## 2015-05-24 NOTE — Discharge Instructions (Signed)
It was our pleasure to provide your ER care today - we hope that you feel better.  Follow up with your doctor/cardiologist in the next 1-2 weeks.  Return to ER if worse, symptoms recur, persistent fast heart beat, weak/fainting, chest pain, trouble breathing, other concern.      Palpitations A palpitation is the feeling that your heartbeat is irregular or is faster than normal. It may feel like your heart is fluttering or skipping a beat. Palpitations are usually not a serious problem. However, in some cases, you may need further medical evaluation. CAUSES  Palpitations can be caused by:  Smoking.  Caffeine or other stimulants, such as diet pills or energy drinks.  Alcohol.  Stress and anxiety.  Strenuous physical activity.  Fatigue.  Certain medicines.  Heart disease, especially if you have a history of irregular heart rhythms (arrhythmias), such as atrial fibrillation, atrial flutter, or supraventricular tachycardia.  An improperly working pacemaker or defibrillator. DIAGNOSIS  To find the cause of your palpitations, your health care provider will take your medical history and perform a physical exam. Your health care provider may also have you take a test called an ambulatory electrocardiogram (ECG). An ECG records your heartbeat patterns over a 24-hour period. You may also have other tests, such as:  Transthoracic echocardiogram (TTE). During echocardiography, sound waves are used to evaluate how blood flows through your heart.  Transesophageal echocardiogram (TEE).  Cardiac monitoring. This allows your health care provider to monitor your heart rate and rhythm in real time.  Holter monitor. This is a portable device that records your heartbeat and can help diagnose heart arrhythmias. It allows your health care provider to track your heart activity for several days, if needed.  Stress tests by exercise or by giving medicine that makes the heart beat faster. TREATMENT    Treatment of palpitations depends on the cause of your symptoms and can vary greatly. Most cases of palpitations do not require any treatment other than time, relaxation, and monitoring your symptoms. Other causes, such as atrial fibrillation, atrial flutter, or supraventricular tachycardia, usually require further treatment. HOME CARE INSTRUCTIONS   Avoid:  Caffeinated coffee, tea, soft drinks, diet pills, and energy drinks.  Chocolate.  Alcohol.  Stop smoking if you smoke.  Reduce your stress and anxiety. Things that can help you relax include:  A method of controlling things in your body, such as your heartbeats, with your mind (biofeedback).  Yoga.  Meditation.  Physical activity such as swimming, jogging, or walking.  Get plenty of rest and sleep. SEEK MEDICAL CARE IF:   You continue to have a fast or irregular heartbeat beyond 24 hours.  Your palpitations occur more often. SEEK IMMEDIATE MEDICAL CARE IF:  You have chest pain or shortness of breath.  You have a severe headache.  You feel dizzy or you faint. MAKE SURE YOU:  Understand these instructions.  Will watch your condition.  Will get help right away if you are not doing well or get worse. Document Released: 09/24/2000 Document Revised: 10/02/2013 Document Reviewed: 11/26/2011 [facility] Patient Information 2015 [place], [place]. This information is not intended to replace advice given to you by your health care provider. Make sure you discuss any questions you have with your health care provider.    Supraventricular Tachycardia Supraventricular tachycardia (SVT) is an abnormal heart rhythm (arrhythmia) that causes the heart to beat very fast (tachycardia). This kind of fast heartbeat originates in the upper chambers of the heart (atria). SVT can cause the  heart to beat greater than 100 beats per minute. SVT can have a rapid burst of heartbeats. This can start and stop suddenly without warning and is called  nonsustained. SVT can also be sustained, in which the heart beats at a continuous fast rate.  CAUSES  There can be different causes of SVT. Some of these include:  Heart valve problems such as mitral valve prolapse.  An enlarged heart (hypertrophic cardiomyopathy).  Congenital heart problems.  Heart inflammation (pericarditis).  Hyperthyroidism.  Low potassium or magnesium levels.  Caffeine.  Drug use such as cocaine, methamphetamines, or stimulants.  Some over-the-counter medicines such as:  Decongestants.  Diet medicines.  Herbal medicines. SYMPTOMS  Symptoms of SVT can vary. Symptoms depend on whether the SVT is sustained or nonsustained. You may experience:  No symptoms (asymptomatic).  An awareness of your heart beating rapidly (palpitations).  Shortness of breath.  Chest pain or pressure. If your blood pressure drops because of the SVT, you may experience:  Fainting or near fainting.  Weakness.  Dizziness. DIAGNOSIS  Different tests can be performed to diagnose SVT, such as:  An electrocardiogram (EKG). This is a painless test that records the electrical activity of your heart.  Holter monitor. This is a 24 hour recording of your heart rhythm. You will be given a diary. Write down all symptoms that you have and what you were doing at the time you experienced symptoms.  Arrhythmia monitor. This is a small device that your wear for several weeks. It records the heart rhythm when you have symptoms.  Echocardiogram. This is an imaging test to help detect abnormal heart structure such as congenital abnormalities, heart valve problems, or heart enlargement.  Stress test. This test can help determine if the SVT is related to exercise.  Electrophysiology study (EPS). This is a procedure that evaluates your heart's electrical system and can help your caregiver find the cause of your SVT. TREATMENT  Treatment of SVT depends on the symptoms, how often it recurs,  and whether there are any underlying heart problems.   If symptoms are rare and no other cardiac disease is present, no treatment may be needed.  Blood work may be done to check potassium, magnesium, and thyroid hormone levels to see if they are abnormal. If these levels are abnormal, treatment to correct the problems will occur. Medicines Your caregiver may use oral medicines to treat SVT. These medicines are given for long-term control of SVT. Medicines may be used alone or in combination with other treatments. These medicines work to slow nerve impulses in the heart muscle. These medicines can also be used to treat high blood pressure. Some of these medicines may include:  Calcium channel blockers.  Beta blockers.  Digoxin. Nonsurgical procedures Nonsurgical techniques may be used if oral medicines do not work. Some examples include:  Cardioversion. This technique uses either drugs or an electrical shock to restore a normal heart rhythm.  Cardioversion drugs may be given through an intravenous (IV) line to help "reset" the heart rhythm.  In electrical cardioversion, the caregiver shocks your heart to stop its beat for a split second. This helps to reset the heart to a normal rhythm.  Ablation. This procedure is done under mild sedation. High frequency radio wave energy is used to destroy the area of heart tissue responsible for the SVT. HOME CARE INSTRUCTIONS   Do not smoke.  Only take medicines prescribed by your caregiver. Check with your caregiver before using over-the-counter medicines.  Check with  your caregiver about how much alcohol and caffeine (coffee, tea, colas, or chocolate) you may have.  It is very important to keep all follow-up referrals and appointments in order to properly manage this problem. SEEK IMMEDIATE MEDICAL CARE IF:  You have dizziness.  You faint or nearly faint.  You have shortness of breath.  You have chest pain or pressure.  You have sudden  nausea or vomiting.  You have profuse sweating.  You are concerned about how long your symptoms last.  You are concerned about the frequency of your SVT episodes. If you have the above symptoms, call your local emergency services (911 in [place] immediately. Do not drive yourself to the hospital. MAKE SURE YOU:   Understand these instructions.  Will watch your condition.  Will get help right away if you are not doing well or get worse. Document Released: 09/27/2005 Document Revised: 12/20/2011 Document Reviewed: 01/09/2009 [facility] Patient Information 2015 [place], [place]. This information is not intended to replace advice given to you by your health care provider. Make sure you discuss any questions you have with your health care provider.

## 2015-05-29 ENCOUNTER — Ambulatory Visit (INDEPENDENT_AMBULATORY_CARE_PROVIDER_SITE_OTHER): Payer: BC Managed Care – PPO | Admitting: Cardiovascular Disease

## 2015-05-29 ENCOUNTER — Other Ambulatory Visit: Payer: Self-pay | Admitting: Cardiovascular Disease

## 2015-05-29 VITALS — BP 136/90 | HR 87 | Ht 72.0 in | Wt 197.5 lb

## 2015-05-29 DIAGNOSIS — Z8669 Personal history of other diseases of the nervous system and sense organs: Secondary | ICD-10-CM | POA: Diagnosis not present

## 2015-05-29 DIAGNOSIS — R000 Tachycardia, unspecified: Secondary | ICD-10-CM

## 2015-05-29 DIAGNOSIS — I471 Supraventricular tachycardia: Secondary | ICD-10-CM

## 2015-05-29 DIAGNOSIS — I10 Essential (primary) hypertension: Secondary | ICD-10-CM | POA: Diagnosis not present

## 2015-05-29 MED ORDER — VERAPAMIL HCL 80 MG PO TABS: ORAL_TABLET | ORAL | Status: DC

## 2015-05-29 MED ORDER — PROPRANOLOL HCL ER 120 MG PO CP24
120.0000 mg | ORAL_CAPSULE | Freq: Every day | ORAL | Status: DC
Start: 2015-05-29 — End: 2017-03-18

## 2015-05-29 NOTE — Patient Instructions (Addendum)
Your physician has requested that you have an echocardiogram. Echocardiography is a painless test that uses sound waves to create images of your heart. It provides your doctor with information about the size and shape of your heart and how well your heart's chambers and valves are working. This procedure takes approximately one hour. There are no restrictions for this procedure.  Your physician has recommended you make the following change in your medication:   1.) the propanolol has been increased  From 80 mg daily to 120 mg daily.  2.) a new prescription for verapramil 80 mg has been added to take as needed for breakthrough symptoms.  These prescriptions has already been sent to your pharmacy.  Your physician recommends that you schedule a follow-up appointment in: 3 months with Dr. Claiborne Billings.

## 2015-05-30 NOTE — Telephone Encounter (Signed)
Rx(s) sent to pharmacy electronically.  

## 2015-05-31 ENCOUNTER — Encounter: Payer: Self-pay | Admitting: Cardiovascular Disease

## 2015-05-31 DIAGNOSIS — I10 Essential (primary) hypertension: Secondary | ICD-10-CM | POA: Insufficient documentation

## 2015-05-31 DIAGNOSIS — I471 Supraventricular tachycardia: Secondary | ICD-10-CM | POA: Insufficient documentation

## 2015-05-31 DIAGNOSIS — Z8669 Personal history of other diseases of the nervous system and sense organs: Secondary | ICD-10-CM | POA: Insufficient documentation

## 2015-05-31 NOTE — Progress Notes (Signed)
Patient ID: [name] [name], female   DOB: 04-20-1971, 44 y.o.   MRN: [id number]     Primary MD: Dr. Christella Noa,   Referring MD: Dr. Corine Shelter  PATIENT PROFILE: [name] [name] is a 44 y.o. female who recently developed an episode of supraventricular tachycardia.  She is referred for cardiology evaluation.   HPI:  [name] [name] admits to a history of intermittent palpitations many years ago.  She has a history of migraine headaches, and several months ago because her blood pressure was also elevated.  She was started on propranolol ER 80 mg.  Since initiating this therapy, her blood pressure has been better controlled and she has not had any further migraine headaches.  On August 13, she was was awakened early a.m. with her heart beating very fast.  This persisted for several hours, such that she went to [facility].  She was found to have superventricular tachycardia with a heart rate of 193.  [facility].  She had a normal CBC and CMP; Free T4 was 0.82.  When she was evaluated in the emergency room, she was  in normal sinus rhythm.  She states the episode lasted approximately 6-7 hours.  The evening before she had one margarita.  She has not use caffeine.  She does not use Sudafed.  She denied associated chest pressure.  She denied being under increased stress.  She presents for cardiology evaluation.  Past Medical History  Diagnosis Date  . Seasonal allergies   . Endometriosis   . SVT (supraventricular tachycardia)     Past Surgical History  Procedure Laterality Date  . Breast lumpectomy Left   . Wrist surgery Left     cyst removal    Allergies  Allergen Reactions  . Sulfa Antibiotics     Current Outpatient Prescriptions  Medication Sig Dispense Refill  . aspirin 81 MG tablet Take 81 mg by mouth daily.    Marland Kitchen levonorgestrel-ethinyl estradiol (SEASONALE,INTROVALE,JOLESSA) 0.15-0.03 MG tablet Take 1 tablet by mouth daily.      .  nortriptyline (PAMELOR) 10 MG capsule Take 1 capsule by mouth daily.    . SUMAtriptan (IMITREX) 50 MG tablet Take 1 tablet by mouth as needed.    . propranolol ER (INDERAL LA) 120 MG 24 hr capsule Take 1 capsule (120 mg total) by mouth daily. 30 capsule 11  . verapamil (CALAN) 80 MG tablet Take 1 tablet (80 mg total) by mouth daily as needed. 90 tablet 0   No current facility-administered medications for this visit.    Social History   Social History  . Marital Status: Single    Spouse Name: N/A  . Number of Children: N/A  . Years of Education: N/A   Occupational History  . Not on file.   Social History Main Topics  . Smoking status: Never Smoker   . Smokeless tobacco: Not on file  . Alcohol Use: Yes     Comment: occ  . Drug Use: No  . Sexual Activity: Not on file   Other Topics Concern  . Not on file   Social History Narrative   Additional social history is notable in that she completed her education at [employer].  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  She is single.  There is no tobacco use.  She only very rarely drinks alcohol.  She does walk  Patient ID: [name] [name], female   DOB: [birth year], 44 y.o.   MRN: [id number]     Primary MD: Dr. Christella Noa,   Referring MD: Dr. Corine Shelter  PATIENT PROFILE: [name] [name] is a 44 y.o. female who recently developed an episode of supraventricular tachycardia.  She is referred for cardiology evaluation.   HPI:  [name] [name] admits to a history of intermittent palpitations many years ago.  She has a history of migraine headaches, and several months ago because her blood pressure was also elevated.  She was started on propranolol ER 80 mg.  Since initiating this therapy, her blood pressure has been better controlled and she has not had any further migraine headaches.  On August 13, she was was awakened early a.m. with her heart beating very fast.  This persisted for several hours, such that she went [facility] [place] [facility].  She was found to have superventricular tachycardia with a heart rate of 193.  [facility].  She had a normal CBC and CMP; Free T4 was 0.82.  When she was evaluated in the emergency room, she was  in normal sinus rhythm.  She states the episode lasted approximately 6-7 hours.  The evening before she had one margarita.  She has not use caffeine.  She does not use Sudafed.  She denied associated chest pressure.  She denied being under increased stress.  She presents for cardiology evaluation.  Past Medical History  Diagnosis Date  . Seasonal allergies   . Endometriosis   . SVT (supraventricular tachycardia)     Past Surgical History  Procedure Laterality Date  . Breast lumpectomy Left   . Wrist surgery Left     cyst removal    Allergies  Allergen Reactions  . Sulfa Antibiotics     Current Outpatient Prescriptions  Medication Sig Dispense Refill  . aspirin 81 MG tablet Take 81 mg by mouth daily.    Marland Kitchen levonorgestrel-ethinyl estradiol (SEASONALE,INTROVALE,JOLESSA) 0.15-0.03 MG tablet Take 1 tablet by mouth daily.      .  nortriptyline (PAMELOR) 10 MG capsule Take 1 capsule by mouth daily.    . SUMAtriptan (IMITREX) 50 MG tablet Take 1 tablet by mouth as needed.    . propranolol ER (INDERAL LA) 120 MG 24 hr capsule Take 1 capsule (120 mg total) by mouth daily. 30 capsule 11  . verapamil (CALAN) 80 MG tablet Take 1 tablet (80 mg total) by mouth daily as needed. 90 tablet 0   No current facility-administered medications for this visit.    Social History   Social History  . Marital Status: Single    Spouse Name: N/A  . Number of Children: N/A  . Years of Education: N/A   Occupational History  . Not on file.   Social History Main Topics  . Smoking status: Never Smoker   . Smokeless tobacco: Not on file  . Alcohol Use: Yes     Comment: occ  . Drug Use: No  . Sexual Activity: Not on file   Other Topics Concern  . Not on file   Social History Narrative   Additional social history is notable in that she completed her education at [employer].  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  She is single.  There is no tobacco use.  She only very rarely drinks alcohol.  She does walk  Patient ID: [name] [name], female   DOB: 04-20-1971, 44 y.o.   MRN: [id number]     Primary MD: Dr. Christella Noa,   Referring MD: Dr. Corine Shelter  PATIENT PROFILE: [name] [name] is a 44 y.o. female who recently developed an episode of supraventricular tachycardia.  She is referred for cardiology evaluation.   HPI:  [name] [name] admits to a history of intermittent palpitations many years ago.  She has a history of migraine headaches, and several months ago because her blood pressure was also elevated.  She was started on propranolol ER 80 mg.  Since initiating this therapy, her blood pressure has been better controlled and she has not had any further migraine headaches.  On August 13, she was was awakened early a.m. with her heart beating very fast.  This persisted for several hours, such that she went to [facility].  She was found to have superventricular tachycardia with a heart rate of 193.  [facility]  She had a normal CBC and CMP; Free T4 was 0.82.  When she was evaluated in the emergency room, she was  in normal sinus rhythm.  She states the episode lasted approximately 6-7 hours.  The evening before she had one margarita.  She has not use caffeine.  She does not use Sudafed.  She denied associated chest pressure.  She denied being under increased stress.  She presents for cardiology evaluation.  Past Medical History  Diagnosis Date  . Seasonal allergies   . Endometriosis   . SVT (supraventricular tachycardia)     Past Surgical History  Procedure Laterality Date  . Breast lumpectomy Left   . Wrist surgery Left     cyst removal    Allergies  Allergen Reactions  . Sulfa Antibiotics     Current Outpatient Prescriptions  Medication Sig Dispense Refill  . aspirin 81 MG tablet Take 81 mg by mouth daily.    Marland Kitchen levonorgestrel-ethinyl estradiol (SEASONALE,INTROVALE,JOLESSA) 0.15-0.03 MG tablet Take 1 tablet by mouth daily.      .  nortriptyline (PAMELOR) 10 MG capsule Take 1 capsule by mouth daily.    . SUMAtriptan (IMITREX) 50 MG tablet Take 1 tablet by mouth as needed.    . propranolol ER (INDERAL LA) 120 MG 24 hr capsule Take 1 capsule (120 mg total) by mouth daily. 30 capsule 11  . verapamil (CALAN) 80 MG tablet Take 1 tablet (80 mg total) by mouth daily as needed. 90 tablet 0   No current facility-administered medications for this visit.    Social History   Social History  . Marital Status: Single    Spouse Name: N/A  . Number of Children: N/A  . Years of Education: N/A   Occupational History  . Not on file.   Social History Main Topics  . Smoking status: Never Smoker   . Smokeless tobacco: Not on file  . Alcohol Use: Yes     Comment: occ  . Drug Use: No  . Sexual Activity: Not on file   Other Topics Concern  . Not on file   Social History Narrative   Additional social history is notable in that she completed her education at [employer].  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  She is single.  There is no tobacco use.  She only very rarely drinks alcohol.  She does walk

## 2015-06-20 ENCOUNTER — Other Ambulatory Visit: Payer: Self-pay

## 2015-06-20 ENCOUNTER — Ambulatory Visit (HOSPITAL_COMMUNITY): Payer: BC Managed Care – PPO | Attending: Internal Medicine

## 2015-06-20 DIAGNOSIS — Z8249 Family history of ischemic heart disease and other diseases of the circulatory system: Secondary | ICD-10-CM | POA: Insufficient documentation

## 2015-06-20 DIAGNOSIS — R000 Tachycardia, unspecified: Secondary | ICD-10-CM | POA: Insufficient documentation

## 2015-07-15 ENCOUNTER — Telehealth: Payer: Self-pay | Admitting: Cardiovascular Disease

## 2015-07-15 NOTE — Telephone Encounter (Signed)
LMTCB

## 2015-07-15 NOTE — Telephone Encounter (Signed)
Pt called in returning a call from a nurse in regards to some test results. Please f/u with her  Thanks

## 2015-07-16 NOTE — Telephone Encounter (Signed)
Left message of echo results for pt on personal voicemail

## 2015-07-16 NOTE — Telephone Encounter (Signed)
Returning call from yesterday.

## 2015-07-18 ENCOUNTER — Encounter: Payer: Self-pay | Admitting: Cardiovascular Disease

## 2015-07-18 NOTE — Telephone Encounter (Signed)
This encounter was created in error - please disregard.

## 2015-08-21 ENCOUNTER — Encounter: Payer: Self-pay | Admitting: Cardiovascular Disease

## 2015-08-25 ENCOUNTER — Ambulatory Visit (INDEPENDENT_AMBULATORY_CARE_PROVIDER_SITE_OTHER): Payer: BC Managed Care – PPO | Admitting: Cardiovascular Disease

## 2015-08-25 ENCOUNTER — Encounter: Payer: Self-pay | Admitting: Cardiovascular Disease

## 2015-08-25 VITALS — BP 122/84 | HR 65 | Ht 72.0 in | Wt 211.3 lb

## 2015-08-25 DIAGNOSIS — I10 Essential (primary) hypertension: Secondary | ICD-10-CM | POA: Diagnosis not present

## 2015-08-25 DIAGNOSIS — I471 Supraventricular tachycardia: Secondary | ICD-10-CM | POA: Diagnosis not present

## 2015-08-25 DIAGNOSIS — Z8669 Personal history of other diseases of the nervous system and sense organs: Secondary | ICD-10-CM

## 2015-08-25 NOTE — Patient Instructions (Signed)
Dr Claiborne Billings recommends that you schedule a follow-up appointment in 1 year. You will receive a reminder letter in the mail two months in advance. If you don't receive a letter, please call our office to schedule the follow-up appointment.  If you need a refill on your cardiac medications before your next appointment, please call your pharmacy.

## 2015-08-25 NOTE — Progress Notes (Signed)
Patient ID: [name] [name], female   DOB: 1971-09-17, 43 y.o.   MRN: [id number]     Primary MD: Dr. Christella Noa,   Referring MD: Dr. Corine Shelter  HPI:  [name] [name] is a 44 y.o. female who was referred to me following an episode of supraventricular tachycardia.  She presents for a 3 month follow-up cardiology evaluation   [name] [name] admits to a history of intermittent palpitations many years ago.  She has a history of migraine headaches, and earlier this year because her blood pressure was also elevated she was started on propranolol ER 80 mg by her primary M.D.  Since initiating this therapy, her blood pressure has been better controlled and she has not had any further migraine headaches.  On August 13, she was was awakened early a.m. with her heart beating very fast.  This persisted for several hours, such that she went to [facility].  She was found to have superventricular tachycardia with a heart rate of 193.  [facility].  She had a normal CBC and CMP; Free T4 was 0.82.  When she was evaluated in the emergency room, she was  in normal sinus rhythm.  She states the episode lasted approximately 6-7 hours.  The evening before she had one margarita.  She has not use caffeine.  She does not use Sudafed.  She denied associated chest pressure.  She denied being under increased stress.    I saw her in 05/29/2015 for initial cardiology consultation.  At that time, she was in sinus rhythm.  Since her episode occurred while on propranolol LA 80 mg I increased this to 120 mg.  I also gave her prescription for verapamil 80 mg to be available on an as-needed basis if she were to develop any breakthrough SVT.  She has been avoiding any caffeine or any pseudoephedrine preparations.  She denies any history of sleep apnea but does not sleep very well.  There is no snoring.  She denies daytime sleepiness.  She underwent an echo Doppler study on 06/20/2015, which was  entirely normal.  Ejection fraction was 55-60%.  She had normal diastolic parameters and normal chamber dimensions.  She presents for follow-up evaluation.  Past Medical History  Diagnosis Date  . Seasonal allergies   . Endometriosis   . SVT (supraventricular tachycardia)     Past Surgical History  Procedure Laterality Date  . Breast lumpectomy Left   . Wrist surgery Left     cyst removal    Allergies  Allergen Reactions  . Sulfa Antibiotics     Current Outpatient Prescriptions  Medication Sig Dispense Refill  . aspirin 81 MG tablet Take 81 mg by mouth daily.    Marland Kitchen levonorgestrel-ethinyl estradiol (SEASONALE,INTROVALE,JOLESSA) 0.15-0.03 MG tablet Take 1 tablet by mouth daily.      . propranolol ER (INDERAL LA) 120 MG 24 hr capsule Take 1 capsule (120 mg total) by mouth daily. 30 capsule 11  . SUMAtriptan (IMITREX) 50 MG tablet Take 1 tablet by mouth as needed.    . verapamil (CALAN) 80 MG tablet Take 1 tablet (80 mg total) by mouth daily as needed. 90 tablet 0   No current facility-administered medications for this visit.    Social History   Social History  . Marital Status: Single    Spouse Name: N/A  . Number of Children: N/A  . Years of Education: N/A   Occupational History  . Not on  Patient ID: [name] [name], female   DOB: 1971-09-17, 43 y.o.   MRN: [id number]     Primary MD: Dr. Christella Noa,   Referring MD: Dr. Corine Shelter  HPI:  [name] [name] is a 44 y.o. female who was referred to me following an episode of supraventricular tachycardia.  She presents for a 3 month follow-up cardiology evaluation   [name] [name] admits to a history of intermittent palpitations many years ago.  She has a history of migraine headaches, and earlier this year because her blood pressure was also elevated she was started on propranolol ER 80 mg by her primary M.D.  Since initiating this therapy, her blood pressure has been better controlled and she has not had any further migraine headaches.  On August 13, she was was awakened early a.m. with her heart beating very fast.  This persisted for several hours, such that she went to [facility].  She was found to have superventricular tachycardia with a heart rate of 193.  [facility].  She had a normal CBC and CMP; Free T4 was 0.82.  When she was evaluated in the emergency room, she was  in normal sinus rhythm.  She states the episode lasted approximately 6-7 hours.  The evening before she had one margarita.  She has not use caffeine.  She does not use Sudafed.  She denied associated chest pressure.  She denied being under increased stress.    I saw her in 05/29/2015 for initial cardiology consultation.  At that time, she was in sinus rhythm.  Since her episode occurred while on propranolol LA 80 mg I increased this to 120 mg.  I also gave her prescription for verapamil 80 mg to be available on an as-needed basis if she were to develop any breakthrough SVT.  She has been avoiding any caffeine or any pseudoephedrine preparations.  She denies any history of sleep apnea but does not sleep very well.  There is no snoring.  She denies daytime sleepiness.  She underwent an echo Doppler study on 06/20/2015, which was  entirely normal.  Ejection fraction was 55-60%.  She had normal diastolic parameters and normal chamber dimensions.  She presents for follow-up evaluation.  Past Medical History  Diagnosis Date  . Seasonal allergies   . Endometriosis   . SVT (supraventricular tachycardia)     Past Surgical History  Procedure Laterality Date  . Breast lumpectomy Left   . Wrist surgery Left     cyst removal    Allergies  Allergen Reactions  . Sulfa Antibiotics     Current Outpatient Prescriptions  Medication Sig Dispense Refill  . aspirin 81 MG tablet Take 81 mg by mouth daily.    Marland Kitchen levonorgestrel-ethinyl estradiol (SEASONALE,INTROVALE,JOLESSA) 0.15-0.03 MG tablet Take 1 tablet by mouth daily.      . propranolol ER (INDERAL LA) 120 MG 24 hr capsule Take 1 capsule (120 mg total) by mouth daily. 30 capsule 11  . SUMAtriptan (IMITREX) 50 MG tablet Take 1 tablet by mouth as needed.    . verapamil (CALAN) 80 MG tablet Take 1 tablet (80 mg total) by mouth daily as needed. 90 tablet 0   No current facility-administered medications for this visit.    Social History   Social History  . Marital Status: Single    Spouse Name: N/A  . Number of Children: N/A  . Years of Education: N/A   Occupational History  . Not on  Patient ID: [name] [name], female   DOB: 1971-09-17, 43 y.o.   MRN: [id number]     Primary MD: Dr. Christella Noa,   Referring MD: Dr. Corine Shelter  HPI:  [name] [name] is a 44 y.o. female who was referred to me following an episode of supraventricular tachycardia.  She presents for a 3 month follow-up cardiology evaluation   [name] [name] admits to a history of intermittent palpitations many years ago.  She has a history of migraine headaches, and earlier this year because her blood pressure was also elevated she was started on propranolol ER 80 mg by her primary M.D.  Since initiating this therapy, her blood pressure has been better controlled and she has not had any further migraine headaches.  On August 13, she was was awakened early a.m. with her heart beating very fast.  This persisted for several hours, such that she went to [facility].  She was found to have superventricular tachycardia with a heart rate of 193.  [facility].  She had a normal CBC and CMP; Free T4 was 0.82.  When she was evaluated in the emergency room, she was  in normal sinus rhythm.  She states the episode lasted approximately 6-7 hours.  The evening before she had one margarita.  She has not use caffeine.  She does not use Sudafed.  She denied associated chest pressure.  She denied being under increased stress.    I saw her in 05/29/2015 for initial cardiology consultation.  At that time, she was in sinus rhythm.  Since her episode occurred while on propranolol LA 80 mg I increased this to 120 mg.  I also gave her prescription for verapamil 80 mg to be available on an as-needed basis if she were to develop any breakthrough SVT.  She has been avoiding any caffeine or any pseudoephedrine preparations.  She denies any history of sleep apnea but does not sleep very well.  There is no snoring.  She denies daytime sleepiness.  She underwent an echo Doppler study on 06/20/2015, which was  entirely normal.  Ejection fraction was 55-60%.  She had normal diastolic parameters and normal chamber dimensions.  She presents for follow-up evaluation.  Past Medical History  Diagnosis Date  . Seasonal allergies   . Endometriosis   . SVT (supraventricular tachycardia)     Past Surgical History  Procedure Laterality Date  . Breast lumpectomy Left   . Wrist surgery Left     cyst removal    Allergies  Allergen Reactions  . Sulfa Antibiotics     Current Outpatient Prescriptions  Medication Sig Dispense Refill  . aspirin 81 MG tablet Take 81 mg by mouth daily.    Marland Kitchen levonorgestrel-ethinyl estradiol (SEASONALE,INTROVALE,JOLESSA) 0.15-0.03 MG tablet Take 1 tablet by mouth daily.      . propranolol ER (INDERAL LA) 120 MG 24 hr capsule Take 1 capsule (120 mg total) by mouth daily. 30 capsule 11  . SUMAtriptan (IMITREX) 50 MG tablet Take 1 tablet by mouth as needed.    . verapamil (CALAN) 80 MG tablet Take 1 tablet (80 mg total) by mouth daily as needed. 90 tablet 0   No current facility-administered medications for this visit.    Social History   Social History  . Marital Status: Single    Spouse Name: N/A  . Number of Children: N/A  . Years of Education: N/A   Occupational History  . Not on

## 2015-09-23 ENCOUNTER — Encounter: Payer: Self-pay | Admitting: *Deleted

## 2016-02-09 DIAGNOSIS — G43009 Migraine without aura, not intractable, without status migrainosus: Secondary | ICD-10-CM | POA: Insufficient documentation

## 2016-10-11 HISTORY — PX: PLANTAR FASCIA SURGERY: SHX746

## 2017-01-27 ENCOUNTER — Ambulatory Visit (INDEPENDENT_AMBULATORY_CARE_PROVIDER_SITE_OTHER): Payer: BC Managed Care – PPO

## 2017-01-27 ENCOUNTER — Ambulatory Visit (INDEPENDENT_AMBULATORY_CARE_PROVIDER_SITE_OTHER): Payer: BC Managed Care – PPO | Admitting: Podiatry

## 2017-01-27 ENCOUNTER — Encounter: Payer: Self-pay | Admitting: Podiatry

## 2017-01-27 VITALS — Ht 72.0 in | Wt 215.0 lb

## 2017-01-27 DIAGNOSIS — M722 Plantar fascial fibromatosis: Secondary | ICD-10-CM

## 2017-01-27 MED ORDER — METHYLPREDNISOLONE 4 MG PO TBPK: ORAL_TABLET | ORAL | 0 refills | Status: DC

## 2017-01-27 MED ORDER — MELOXICAM 15 MG PO TABS: 15.0000 mg | ORAL_TABLET | Freq: Every day | ORAL | 3 refills | Status: DC

## 2017-01-27 NOTE — Patient Instructions (Signed)

## 2017-01-27 NOTE — Progress Notes (Signed)
Subjective:    Patient ID: [name] [name], female    DOB: [birth year], 46 y.o.   MRN: [id number]  HPI: She presents today for chief complaint of plantar heel pain bilateral left greater than right. She states it's been bothering for about the past 6 months. She is a fifth grade elementary school teacher. She's been diagnosed with plantar fasciitis in the past. She's been using a night splint and a boot. Been doing home physical therapy and no avail.    Review of Systems  All other systems reviewed and are negative.      Objective:   Physical Exam: Vital signs are stable alert and oriented 3. Pulses are palpable. Neurologic services intact. Deep tendon reflexes are intact. Muscle strength +5 over 5 dorsiflexion plantar flexors and inverters everters on physical musculatures intact. Orthopedic evaluation of his wrist pain palpation me to continue tubercle bilateral. Radiographs demonstrate soft tissue increase in density plantar fascia insertion sites bilateral. Cutaneous evaluation Mr. is no open lesions or wounds.        Assessment & Plan:  Assessment: Plantar fasciitis bilateral.  Plan: Injected the bilateral heels today with Kenalog and local anesthetic distributed plantar fascia braces bilaterally. She will continue the night splint. Discussed appropriate shoe gear stretching exercises ice therapy. She was also scanned for several orthotics. Dispensed a prescription for Medrol Dosepak to be followed by meloxicam.

## 2017-01-31 ENCOUNTER — Telehealth: Payer: Self-pay | Admitting: *Deleted

## 2017-01-31 NOTE — Telephone Encounter (Addendum)
Pt states she was given braces and steroids and antiinflammatory medications. Pt states her feet began to swell yesterday, so much she is not able to fasten the straps. I left message telling pt I needed to make sure she was not wearing the straps at night and that she was only taking the steroid pack and had not started the antiinflammatory yet. I told pt to take the steroid pack to completion, wear the straps as long as she could through the day and ice 3-4 times a day for 15-20 minutes per session, even if was just in the evening. I told to use regular strength tylenol if she tolerate, take as directed. Pt states she received my call and will begin ice therapy, but has been on steroids before and never had this type of problem. I told pt that is not unusual to have swelling on a steroid, but could not just stop a steroid pack. Pt states she had also been told she is flush, and I told her that was related to the steroids as well.

## 2017-02-22 ENCOUNTER — Encounter: Payer: Self-pay | Admitting: Internal Medicine

## 2017-02-22 ENCOUNTER — Ambulatory Visit (INDEPENDENT_AMBULATORY_CARE_PROVIDER_SITE_OTHER): Payer: BC Managed Care – PPO | Admitting: Internal Medicine

## 2017-02-22 VITALS — BP 118/80 | HR 80 | Ht 71.5 in | Wt 219.6 lb

## 2017-02-22 DIAGNOSIS — I471 Supraventricular tachycardia: Secondary | ICD-10-CM | POA: Diagnosis not present

## 2017-02-22 MED ORDER — DILTIAZEM HCL 120 MG PO TABS: 120.0000 mg | ORAL_TABLET | Freq: Every day | ORAL | 3 refills | Status: DC

## 2017-02-22 NOTE — Patient Instructions (Signed)
Your physician recommends that you continue on your current medications as directed. Please refer to the Current Medication list given to you today.  Please call to schedule SVT ablation. Some available dates for this procedure with Dr. Lovena Le are:  Mon 5/21 Ludwig Clarks 6/1 Mon 6/18 Thurs 6/27

## 2017-02-22 NOTE — Progress Notes (Signed)
HPI [name] is referred today by Dr. Claiborne Billings for evaluation of SVT. She is a pleasant 46 yo woman with a h/o palpitations and HR's of upto 200/min. She has sought medical attention and treated with IV adenosine without termination of her SVT. She notes palpitations and sob but no syncope or chest pain. These episodes have lasted for up to 6 hours. The patient has been placed on both propranolol and diltaizem for control. She notes that she had breakthroughs on propranolol alone.  Allergies  Allergen Reactions  . Sulfa Antibiotics     Other reaction(s): Fever  . Adhesive [Tape]     Swelling and itching     Current Outpatient Prescriptions  Medication Sig Dispense Refill  . aspirin 81 MG tablet Take 81 mg by mouth daily.    . cetirizine (ZYRTEC) 10 MG tablet Take 10 mg by mouth daily.     Marland Kitchen diltiazem (CARDIZEM) 120 MG tablet Take 1 tablet (120 mg total) by mouth daily. 90 tablet 3  . levonorgestrel-ethinyl estradiol (SEASONALE,INTROVALE,JOLESSA) 0.15-0.03 MG tablet Take 1 tablet by mouth daily.      . meloxicam (MOBIC) 15 MG tablet Take 1 tablet (15 mg total) by mouth daily. 30 tablet 3  . propranolol ER (INDERAL LA) 120 MG 24 hr capsule Take 1 capsule (120 mg total) by mouth daily. 30 capsule 11  . SUMAtriptan (IMITREX) 50 MG tablet Take 1 tablet by mouth 3 times/day as needed-between meals & bedtime. Take as directed for migraines     No current facility-administered medications for this visit.      Past Medical History:  Diagnosis Date  . Endometriosis   . Seasonal allergies   . SVT (supraventricular tachycardia) (HCC)     ROS:   All systems reviewed and negative except as noted in the HPI.   Past Surgical History:  Procedure Laterality Date  . BREAST LUMPECTOMY Left   . WRIST SURGERY Left    cyst removal     No family history on file.   Social History   Social History  . Marital status: Single    Spouse name: N/A  . Number of children: N/A  . Years  of education: N/A   Occupational History  . Not on file.   Social History Main Topics  . Smoking status: Never Smoker  . Smokeless tobacco: Never Used  . Alcohol use Yes     Comment: occ  . Drug use: No  . Sexual activity: Not on file   Other Topics Concern  . Not on file   Social History Narrative  . No narrative on file     BP 118/80   Pulse 80   Ht 5' 11.5" (1.816 m)   Wt 219 lb 9.6 oz (99.6 kg)   SpO2 97%   BMI 30.20 kg/m   Physical Exam:  Well appearing 46 yo woman, NAD HEENT: Unremarkable Neck:  6 cm JVD, no thyromegally Lymphatics:  No adenopathy Back:  No CVA tenderness Lungs:  Clear with no wheezes HEART:  Regular rate rhythm, no murmurs, no rubs, no clicks Abd:  soft, positive bowel sounds, no organomegally, no rebound, no guarding Ext:  2 plus pulses, no edema, no cyanosis, no clubbing Skin:  No rashes no nodules Neuro:  CN II through XII intact, motor grossly intact  EKG - nsr with no pre-excitation.   Assess/Plan: 1. SVT - we discussed the treatment options. She may have an atrial tachycardia. She may also  have a re-entrant SVT. I discussed the risk/benefits/goals/expectations of catheter ablation with the patient and she will call us if she would like to proceed with ablation.  2. HTN - her blood pressure is normal today although she is on both beta blocker and calcium channel blockers.  Mikle Bosworth.D.

## 2017-02-23 ENCOUNTER — Telehealth: Payer: Self-pay | Admitting: Internal Medicine

## 2017-02-23 DIAGNOSIS — Z01812 Encounter for preprocedural laboratory examination: Secondary | ICD-10-CM

## 2017-02-23 NOTE — Telephone Encounter (Signed)
Patient states she saw Dr. Lovena Le yesterday and she is calling back to schedule the ablation that was discussed during her visit.

## 2017-02-23 NOTE — Telephone Encounter (Signed)
Spoke with patient and informed procedure scheduled for 6/1 at 2:30 pm, arrive by 12:30 pm, prepare to stay overnight.  Labs 03/02/17. Instruction letter will be at front desk, advised to take last dose of diltiazem and propranolol on Tue 5/29.  (She takes both at bedtime).

## 2017-02-24 ENCOUNTER — Ambulatory Visit (INDEPENDENT_AMBULATORY_CARE_PROVIDER_SITE_OTHER): Payer: BC Managed Care – PPO | Admitting: Podiatry

## 2017-02-24 ENCOUNTER — Encounter: Payer: Self-pay | Admitting: Podiatry

## 2017-02-24 DIAGNOSIS — M722 Plantar fascial fibromatosis: Secondary | ICD-10-CM

## 2017-02-24 NOTE — Patient Instructions (Signed)

## 2017-02-26 NOTE — Progress Notes (Signed)
She presents today states that her feet are feeling better she has no pain. She picked up her orthotics on 02/24/2017. She states that she wears Allegretti and that they do not seem to fit into her shoes. Well. We will have her see Liliane Channel before she leaves.  Objective: Vital signs are stable alert and oriented 3. Pulses are palpable. She has no pain on palpation of the bilateral heels.  Assessment: Well-healing plantar fasciitis.  Plan: Continue all conservative therapies at this point included braces use of her orthotics nonsteroidals and appropriate shoe gear. I will follow-up with her in 1 month. She saw Liliane Channel today before she left and the orthotics were to be corrected.

## 2017-02-28 ENCOUNTER — Institutional Professional Consult (permissible substitution): Payer: BC Managed Care – PPO | Admitting: Cardiology

## 2017-02-28 ENCOUNTER — Encounter: Payer: Self-pay | Admitting: *Deleted

## 2017-03-02 ENCOUNTER — Other Ambulatory Visit: Payer: BC Managed Care – PPO | Admitting: *Deleted

## 2017-03-02 DIAGNOSIS — I10 Essential (primary) hypertension: Secondary | ICD-10-CM

## 2017-03-02 DIAGNOSIS — Z01812 Encounter for preprocedural laboratory examination: Secondary | ICD-10-CM

## 2017-03-03 LAB — CBC WITH DIFFERENTIAL/PLATELET
BASOS: 0 %
Basophils Absolute: 0 10*3/uL (ref 0.0–0.2)
EOS (ABSOLUTE): 0.2 10*3/uL (ref 0.0–0.4)
EOS: 2 %
HEMATOCRIT: 43.8 % (ref 34.0–46.6)
Hemoglobin: 14.3 g/dL (ref 11.1–15.9)
IMMATURE GRANS (ABS): 0.1 10*3/uL (ref 0.0–0.1)
IMMATURE GRANULOCYTES: 1 %
LYMPHS: 24 %
Lymphocytes Absolute: 2.8 10*3/uL (ref 0.7–3.1)
MCH: 30 pg (ref 26.6–33.0)
MCHC: 32.6 g/dL (ref 31.5–35.7)
MCV: 92 fL (ref 79–97)
Monocytes Absolute: 1 10*3/uL — ABNORMAL HIGH (ref 0.1–0.9)
Monocytes: 8 %
NEUTROS PCT: 65 %
Neutrophils Absolute: 7.6 10*3/uL — ABNORMAL HIGH (ref 1.4–7.0)
PLATELETS: 354 10*3/uL (ref 150–379)
RBC: 4.76 x10E6/uL (ref 3.77–5.28)
RDW: 13 % (ref 12.3–15.4)
WBC: 11.7 10*3/uL — AB (ref 3.4–10.8)

## 2017-03-03 LAB — COMPREHENSIVE METABOLIC PANEL
A/G RATIO: 1.9 (ref 1.2–2.2)
ALT: 16 IU/L (ref 0–32)
AST: 16 IU/L (ref 0–40)
Albumin: 4.4 g/dL (ref 3.5–5.5)
Alkaline Phosphatase: 47 IU/L (ref 39–117)
BUN/Creatinine Ratio: 22 (ref 9–23)
BUN: 22 mg/dL (ref 6–24)
Bilirubin Total: 0.3 mg/dL (ref 0.0–1.2)
CALCIUM: 9.4 mg/dL (ref 8.7–10.2)
CO2: 23 mmol/L (ref 18–29)
CREATININE: 0.98 mg/dL (ref 0.57–1.00)
Chloride: 102 mmol/L (ref 96–106)
GFR, EST AFRICAN AMERICAN: 81 mL/min/{1.73_m2} (ref >59)
GFR, EST NON AFRICAN AMERICAN: 70 mL/min/{1.73_m2} (ref >59)
Globulin, Total: 2.3 g/dL (ref 1.5–4.5)
Glucose: 86 mg/dL (ref 65–99)
POTASSIUM: 4.7 mmol/L (ref 3.5–5.2)
Sodium: 139 mmol/L (ref 134–144)
TOTAL PROTEIN: 6.7 g/dL (ref 6.0–8.5)

## 2017-03-03 LAB — PROTIME-INR
INR: 0.9 (ref 0.8–1.2)
PROTHROMBIN TIME: 10.1 s (ref 9.1–12.0)

## 2017-03-11 ENCOUNTER — Encounter (HOSPITAL_COMMUNITY)
Admission: RE | Disposition: A | Discharge status: Home / Self Care | Payer: Self-pay | Source: Ambulatory Visit | Attending: Internal Medicine

## 2017-03-11 ENCOUNTER — Ambulatory Visit (HOSPITAL_COMMUNITY)
Admission: RE | Admit: 2017-03-11 | Discharge: 2017-03-12 | Disposition: A | Discharge status: Home / Self Care | Payer: BC Managed Care – PPO | Source: Ambulatory Visit | Attending: Internal Medicine | Admitting: Internal Medicine

## 2017-03-11 DIAGNOSIS — Z888 Allergy status to other drugs, medicaments and biological substances status: Secondary | ICD-10-CM | POA: Diagnosis not present

## 2017-03-11 DIAGNOSIS — Z79899 Other long term (current) drug therapy: Secondary | ICD-10-CM | POA: Diagnosis not present

## 2017-03-11 DIAGNOSIS — G43909 Migraine, unspecified, not intractable, without status migrainosus: Secondary | ICD-10-CM | POA: Diagnosis not present

## 2017-03-11 DIAGNOSIS — I471 Supraventricular tachycardia, unspecified: Secondary | ICD-10-CM | POA: Diagnosis present

## 2017-03-11 DIAGNOSIS — Z7982 Long term (current) use of aspirin: Secondary | ICD-10-CM | POA: Insufficient documentation

## 2017-03-11 DIAGNOSIS — I10 Essential (primary) hypertension: Secondary | ICD-10-CM | POA: Insufficient documentation

## 2017-03-11 DIAGNOSIS — Z882 Allergy status to sulfonamides status: Secondary | ICD-10-CM | POA: Diagnosis not present

## 2017-03-11 HISTORY — PX: SVT ABLATION: EP1225

## 2017-03-11 LAB — PREGNANCY, URINE: Preg Test, Ur: NEGATIVE

## 2017-03-11 SURGERY — SVT ABLATION

## 2017-03-11 MED ORDER — SUMATRIPTAN SUCCINATE 50 MG PO TABS
50.0000 mg | ORAL_TABLET | Freq: Two times a day (BID) | ORAL | Status: DC | PRN
  Filled 2017-03-11: qty 1

## 2017-03-11 MED ORDER — LEVONORGEST-ETH ESTRAD 91-DAY 0.15-0.03 MG PO TABS: 1.0000 | ORAL_TABLET | Freq: Every evening | ORAL | Status: DC

## 2017-03-11 MED ORDER — SODIUM CHLORIDE 0.9% FLUSH
3.0000 mL | Freq: Two times a day (BID) | INTRAVENOUS | Status: DC
  Administered 2017-03-11: 3 mL via INTRAVENOUS

## 2017-03-11 MED ORDER — MIDAZOLAM HCL 5 MG/5ML IJ SOLN
INTRAMUSCULAR | Status: AC
  Filled 2017-03-11: qty 5

## 2017-03-11 MED ORDER — MIDAZOLAM HCL 5 MG/5ML IJ SOLN
INTRAMUSCULAR | Status: DC | PRN
  Administered 2017-03-11 (×5): 1 mg via INTRAVENOUS
  Administered 2017-03-11: 2 mg via INTRAVENOUS
  Administered 2017-03-11: 1 mg via INTRAVENOUS

## 2017-03-11 MED ORDER — HEPARIN (PORCINE) IN NACL 2-0.9 UNIT/ML-% IJ SOLN
INTRAMUSCULAR | Status: AC | PRN
  Administered 2017-03-11: 500 mL

## 2017-03-11 MED ORDER — BUPIVACAINE HCL (PF) 0.25 % IJ SOLN
INTRAMUSCULAR | Status: DC | PRN
  Administered 2017-03-11 (×2): 20 mL

## 2017-03-11 MED ORDER — ONDANSETRON HCL 4 MG/2ML IJ SOLN: 4.0000 mg | Freq: Four times a day (QID) | INTRAMUSCULAR | Status: DC | PRN

## 2017-03-11 MED ORDER — SODIUM CHLORIDE 0.9 % IV SOLN: 250.0000 mL | INTRAVENOUS | Status: DC | PRN

## 2017-03-11 MED ORDER — ACETAMINOPHEN 325 MG PO TABS: 650.0000 mg | ORAL_TABLET | ORAL | Status: DC | PRN

## 2017-03-11 MED ORDER — BUPIVACAINE HCL (PF) 0.25 % IJ SOLN
INTRAMUSCULAR | Status: AC
  Filled 2017-03-11: qty 30

## 2017-03-11 MED ORDER — SODIUM CHLORIDE 0.9 % IV SOLN
INTRAVENOUS | Status: DC
  Administered 2017-03-11: 14:00:00 via INTRAVENOUS

## 2017-03-11 MED ORDER — HEPARIN (PORCINE) IN NACL 2-0.9 UNIT/ML-% IJ SOLN
INTRAMUSCULAR | Status: AC
  Filled 2017-03-11: qty 500

## 2017-03-11 MED ORDER — SODIUM CHLORIDE 0.9% FLUSH: 3.0000 mL | INTRAVENOUS | Status: DC | PRN

## 2017-03-11 MED ORDER — FENTANYL CITRATE (PF) 100 MCG/2ML IJ SOLN
INTRAMUSCULAR | Status: DC | PRN
  Administered 2017-03-11: 25 ug via INTRAVENOUS
  Administered 2017-03-11 (×6): 12.5 ug via INTRAVENOUS

## 2017-03-11 MED ORDER — MELOXICAM 7.5 MG PO TABS
15.0000 mg | ORAL_TABLET | Freq: Every day | ORAL | Status: DC
  Filled 2017-03-11: qty 2

## 2017-03-11 MED ORDER — ASPIRIN EC 81 MG PO TBEC
81.0000 mg | DELAYED_RELEASE_TABLET | Freq: Every evening | ORAL | Status: DC
  Administered 2017-03-11: 81 mg via ORAL
  Filled 2017-03-11: qty 1

## 2017-03-11 MED ORDER — FENTANYL CITRATE (PF) 100 MCG/2ML IJ SOLN
INTRAMUSCULAR | Status: AC
  Filled 2017-03-11: qty 2

## 2017-03-11 SURGICAL SUPPLY — 12 items
BAG SNAP BAND KOVER 36X36 (MISCELLANEOUS) ×1 IMPLANT
CATH CELSIUS THERM B CV 7F (ABLATOR) ×1 IMPLANT
CATH HEX JOS 2-5-2 65CM 6F REP (CATHETERS) ×1 IMPLANT
CATH JOSEPH QUAD ALLRED 6F REP (CATHETERS) ×2 IMPLANT
PACK EP LATEX FREE (CUSTOM PROCEDURE TRAY) ×2
PACK EP LF (CUSTOM PROCEDURE TRAY) IMPLANT
PAD DEFIB LIFELINK (PAD) ×1 IMPLANT
PATCH CARTO3 (PAD) ×1 IMPLANT
SHEATH PINNACLE 6F 10CM (SHEATH) ×2 IMPLANT
SHEATH PINNACLE 7F 10CM (SHEATH) ×1 IMPLANT
SHEATH PINNACLE 8F 10CM (SHEATH) ×1 IMPLANT
SHIELD RADPAD SCOOP 12X17 (MISCELLANEOUS) ×1 IMPLANT

## 2017-03-11 NOTE — Discharge Instructions (Signed)
No driving for 4 days. No lifting over 5 lbs for 1 week. No vigorous or sexual activity for 1 week. You may return to work on 03/18/17. Keep procedure site clean & dry. If you notice increased pain, swelling, bleeding or pus, call/return!  You may shower, but no soaking baths/hot tubs/pools for 1 week.

## 2017-03-11 NOTE — H&P (View-Only) (Signed)
HPI Ms. Berger is referred today by Dr. Claiborne Billings for evaluation of SVT. She is a pleasant 46 yo woman with a h/o palpitations and HR's of upto 200/min. She has sought medical attention and treated with IV adenosine without termination of her SVT. She notes palpitations and sob but no syncope or chest pain. These episodes have lasted for up to 6 hours. The patient has been placed on both propranolol and diltaizem for control. She notes that she had breakthroughs on propranolol alone.  Allergies  Allergen Reactions  . Sulfa Antibiotics     Other reaction(s): Fever  . Adhesive [Tape]     Swelling and itching     Current Outpatient Prescriptions  Medication Sig Dispense Refill  . aspirin 81 MG tablet Take 81 mg by mouth daily.    . cetirizine (ZYRTEC) 10 MG tablet Take 10 mg by mouth daily.     Marland Kitchen diltiazem (CARDIZEM) 120 MG tablet Take 1 tablet (120 mg total) by mouth daily. 90 tablet 3  . levonorgestrel-ethinyl estradiol (SEASONALE,INTROVALE,JOLESSA) 0.15-0.03 MG tablet Take 1 tablet by mouth daily.      . meloxicam (MOBIC) 15 MG tablet Take 1 tablet (15 mg total) by mouth daily. 30 tablet 3  . propranolol ER (INDERAL LA) 120 MG 24 hr capsule Take 1 capsule (120 mg total) by mouth daily. 30 capsule 11  . SUMAtriptan (IMITREX) 50 MG tablet Take 1 tablet by mouth 3 times/day as needed-between meals & bedtime. Take as directed for migraines     No current facility-administered medications for this visit.      Past Medical History:  Diagnosis Date  . Endometriosis   . Seasonal allergies   . SVT (supraventricular tachycardia) (HCC)     ROS:   All systems reviewed and negative except as noted in the HPI.   Past Surgical History:  Procedure Laterality Date  . BREAST LUMPECTOMY Left   . WRIST SURGERY Left    cyst removal     No family history on file.   Social History   Social History  . Marital status: Single    Spouse name: N/A  . Number of children: N/A  . Years  of education: N/A   Occupational History  . Not on file.   Social History Main Topics  . Smoking status: Never Smoker  . Smokeless tobacco: Never Used  . Alcohol use Yes     Comment: occ  . Drug use: No  . Sexual activity: Not on file   Other Topics Concern  . Not on file   Social History Narrative  . No narrative on file     BP 118/80   Pulse 80   Ht 5' 11.5" (1.816 m)   Wt 219 lb 9.6 oz (99.6 kg)   SpO2 97%   BMI 30.20 kg/m   Physical Exam:  Well appearing 46 yo woman, NAD HEENT: Unremarkable Neck:  6 cm JVD, no thyromegally Lymphatics:  No adenopathy Back:  No CVA tenderness Lungs:  Clear with no wheezes HEART:  Regular rate rhythm, no murmurs, no rubs, no clicks Abd:  soft, positive bowel sounds, no organomegally, no rebound, no guarding Ext:  2 plus pulses, no edema, no cyanosis, no clubbing Skin:  No rashes no nodules Neuro:  CN II through XII intact, motor grossly intact  EKG - nsr with no pre-excitation.   Assess/Plan: 1. SVT - we discussed the treatment options. She may have an atrial tachycardia. She may also  have a re-entrant SVT. I discussed the risk/benefits/goals/expectations of catheter ablation with the patient and she will call us if she would like to proceed with ablation.  2. HTN - her blood pressure is normal today although she is on both beta blocker and calcium channel blockers.  Mikle Bosworth.D.

## 2017-03-11 NOTE — Progress Notes (Signed)
Site area: 3 rt fv sheaths Site Prior to Removal:  Level 0 Pressure Applied For:  15 minutes Manual:   yes Patient Status During Pull:  stable Post Pull Site:  Level  0 Post Pull Instructions Given:  yes Post Pull Pulses Present: palpable Dressing Applied:  Gauze and tegaderm Bedrest begins @ 0017 Comments:

## 2017-03-11 NOTE — Progress Notes (Signed)
Site area: rt ij venous sheath Site Prior to Removal:  Level 0 Pressure Applied For: 10 minutes Manual:   yes Patient Status During Pull:  stable Post Pull Site:  Level 0 Post Pull Instructions Given:  yes Post Pull Pulses Present: na Dressing Applied:  Gauze and small tegaderm Bedrest begins @  Comments:

## 2017-03-11 NOTE — Discharge Summary (Signed)
ELECTROPHYSIOLOGY PROCEDURE DISCHARGE SUMMARY    Patient ID: [name] [name],  MRN: [id number], DOB/AGE: 12-Jun-1971 46 y.o.  Admit date: 03/11/2017 Discharge date: 03/12/17  Primary Care Physician: Joycelyn Rua, MD  Primary Cardiologist: Dr. Tresa Endo Electrophysiologist: Dr. Ladona Ridgel  Primary Discharge Diagnosis:  1. SVT   Allergies  Allergen Reactions  . Sulfa Antibiotics     Other reaction(s): Fever  . Adhesive [Tape]     Swelling and itching     Procedures This Admission: 1.  Electrophysiology study and radiofrequency catheter ablation on 03/11/17 by Dr Ladona Ridgel.   This study demonstrated I CONCLUSIONS:  1. Sinus rhythm upon presentation.  2. 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 Successful radiofrequency modification of the slow AV nodal pathway  4. No inducible arrhythmias following ablation.  5. No early apparent complications    Brief HPI: [name] [name] is a 46 y.o. female with a past medical history of SVT.  SHe has had increasing tachypalpitations with documented SVT.  They have failed medical therapy with CCB/BB.  Risks, benefits, and alternatives to ablation were reviewed with the patient who wished to proceed.   Hospital Course:  The patient was admitted and underwent EPS/RFCA with details as outlined above. She was monitored on telemetry overnight which demonstrated NSR.  Groin and neck incisions were without complication.  The patient was examined by Dr. Ladona Ridgel and considered stable for discharge to home.  Follow up will has been arranged for 4 weeks.  Wound care and restrictions were reviewed with the patient prior to discharge. she will continue her beta blocker which she takes for both hypertension and migraines. Stop CCB.     Discharge Vitals: Blood pressure 123/75, pulse 77, temperature 98.8 F (37.1 C), temperature source  Oral, resp. rate 18, height 6' (1.829 m), weight 215 lb (97.5 kg), SpO2 100 %.  Labs:   Lab Results  Component Value Date   WBC 11.7 (H) 03/02/2017   HGB 16.2 (H) 05/24/2015   HCT 43.8 03/02/2017   MCV 92 03/02/2017   PLT 354 03/02/2017   No results for input(s): NA, K, CL, CO2, BUN, CREATININE, CALCIUM, PROT, BILITOT, ALKPHOS, ALT, AST, GLUCOSE in the last 168 hours.  Invalid input(s): LABALBU  Discharge Medications:  Allergies as of 03/12/2017      Reactions   Sulfa Antibiotics    Other reaction(s): Fever   Adhesive [tape]    Swelling and itching      Medication List    STOP taking these medications   diltiazem 120 MG tablet Commonly known as:  CARDIZEM     TAKE these medications   aspirin 81 MG tablet Take 81 mg by mouth every evening.   cetirizine 10 MG tablet Commonly known as:  ZYRTEC Take 10 mg by mouth every evening.   levonorgestrel-ethinyl estradiol 0.15-0.03 MG tablet Commonly known as:  SEASONALE,INTROVALE,JOLESSA Take 1 tablet by mouth every evening.   meloxicam 15 MG tablet Commonly known as:  MOBIC Take 1 tablet (15 mg total) by mouth daily.   propranolol ER 120 MG 24 hr capsule Commonly known as:  INDERAL LA Take 1 capsule (120 mg total) by mouth daily. What changed:  when to take this   SUMAtriptan 50 MG tablet Commonly known as:  IMITREX Take 1 tablet by mouth 2 (two) times daily as needed for migraine. Take as directed for migraines  Disposition:  Home Discharge Instructions    Diet - low sodium heart healthy    Complete by:  As directed    Increase activity slowly    Complete by:  As directed      Follow-up Information    Sheilah Pigeon, PA-C Follow up on 04/11/2017.   Specialty:  Cardiology Why:  1:00PM Contact information: [address] [place] [address] [phone]           Duration of Discharge Encounter: Greater than 30 minutes including physician time.  Lorelei Pont,  PA-C 03/12/2017 10:46 AM  EP Attending  Patient seen and examined. Agree with above. She is stable for DC home. Usual followup.  Leonia Reeves.D.

## 2017-03-11 NOTE — Interval H&P Note (Signed)
History and Physical Interval Note:  03/11/2017 3:50 PM  [name] [name]  has presented today for surgery, with the diagnosis of svt  The various methods of treatment have been discussed with the patient and family. After consideration of risks, benefits and other options for treatment, the patient has consented to  Procedure(s): SVT Ablation (N/A) as a surgical intervention .  The patient's history has been reviewed, patient examined, no change in status, stable for surgery.  I have reviewed the patient's chart and labs.  Questions were answered to the patient's satisfaction.     [name]

## 2017-03-12 ENCOUNTER — Encounter (HOSPITAL_COMMUNITY): Payer: Self-pay | Admitting: *Deleted

## 2017-03-12 DIAGNOSIS — I471 Supraventricular tachycardia: Secondary | ICD-10-CM | POA: Diagnosis not present

## 2017-03-12 NOTE — Progress Notes (Signed)
Progress Note  Patient Name: [name] [name] Date of Encounter: 03/12/2017  Primary Cardiologist: Tresa Endo  Subjective   Doing well this morning after EP study and catheter ablation of AVNRT. No chest or leg pain.  Inpatient Medications    Scheduled Meds: . aspirin EC  81 mg Oral QPM  . meloxicam  15 mg Oral Daily  . sodium chloride flush  3 mL Intravenous Q12H   Continuous Infusions: . sodium chloride     PRN Meds: sodium chloride, acetaminophen, ondansetron (ZOFRAN) IV, sodium chloride flush, SUMAtriptan   Vital Signs    Vitals:   03/11/17 1843 03/11/17 1856 03/12/17 0001 03/12/17 0700  BP: 128/81 133/78 120/77 123/75  Pulse:   88 77  Resp:   12 18  Temp:   98.4 F (36.9 C) 98.8 F (37.1 C)  TempSrc:   Oral Oral  SpO2: 100% 100% 98% 100%  Weight:      Height:       No intake or output data in the 24 hours ending 03/12/17 0955 Filed Weights   03/11/17 1229  Weight: 215 lb (97.5 kg)    Telemetry    nsr - Personally Reviewed  ECG    nsr - Personally Reviewed  Physical Exam   GEN: No acute distress.   Neck: 6 cm JVD Cardiac: RRR, no murmurs, rubs, or gallops.  Respiratory: Clear to auscultation bilaterally. GI: Soft, nontender, non-distended  MS: No edema; No deformity. Neuro:  Nonfocal  Psych: Normal affect   Labs    ChemistryNo results for input(s): NA, K, CL, CO2, GLUCOSE, BUN, CREATININE, CALCIUM, PROT, ALBUMIN, AST, ALT, ALKPHOS, BILITOT, GFRNONAA, GFRAA, ANIONGAP in the last 168 hours.   HematologyNo results for input(s): WBC, RBC, HGB, HCT, MCV, MCH, MCHC, RDW, PLT in the last 168 hours.  Cardiac EnzymesNo results for input(s): TROPONINI in the last 168 hours. No results for input(s): TROPIPOC in the last 168 hours.   BNPNo results for input(s): BNP, PROBNP in the last 168 hours.   DDimer No results for input(s): DDIMER in the last 168 hours.   Radiology    No results found.  Cardiac Studies   none  Patient Profile     46 y.o.  female admitted for EPS/RFA of AVNRT.   Assessment & Plan    1. SVT - she is s/p ablation and doing well. She is stable for DC.  2. HTN - she will continue her beta blocker which she takes for both hypertension and migraines.   Jonavan Vanhorn,M.D.  Signed, Lewayne Bunting, MD  03/12/2017, 9:55 AM  Patient ID: [name] [name], female   DOB: 01-Apr-1971, 46 y.o.   MRN: [id number]

## 2017-03-14 ENCOUNTER — Telehealth: Payer: Self-pay | Admitting: Internal Medicine

## 2017-03-14 ENCOUNTER — Encounter (HOSPITAL_COMMUNITY): Payer: Self-pay | Admitting: Internal Medicine

## 2017-03-14 NOTE — Telephone Encounter (Signed)
New message    Pt states her heart rate was in 50's and bp was low after ablation and she is afraid to take her medication

## 2017-03-14 NOTE — Telephone Encounter (Signed)
Called, spoke with pt. Pt stated BPs have been 80s-90s/60s, HR 50s. Pt stated she stopped taking Propranolol Saturday night. Pt stated BP and HR was improved on Sunday. BP Sunday - today 90s-100s/60s, HR 60s. Pt wants to know if she should start it back on a lower dose? Will forward to Dr. Lovena Le to advise.

## 2017-03-14 NOTE — Telephone Encounter (Signed)
Called, spoke with pt. Informed Dr. Lovena Le recommended pt to HOLD Propranolol. Pt has f/u appt with Tommye Standard, PA on 04/11/17. Informed pt to call with questions or concerns prior to f/u appt on 04/11/17. Pt verbalized understanding.

## 2017-03-17 ENCOUNTER — Telehealth: Payer: Self-pay | Admitting: Internal Medicine

## 2017-03-17 ENCOUNTER — Encounter: Payer: BC Managed Care – PPO | Admitting: Orthotics

## 2017-03-17 NOTE — Telephone Encounter (Signed)
Spoke with Pt who states she can feel her HR pounding in her chest on and off all day and that it started after her ablation. Prior to her Ablation (03/11/17) pt did not feel any palpitations. She stated her HR could be 200, and she wouldn't feel it racing that fast. She says she feels hot and sweaty. At the moment of the phone call, she was driving to another Dr. Hilaria Ota. She does not feel like she is going to faint. She c/o dizziness and SOB that comes and goes. She stated today after she put on her shoes, she felt hot and clammy and she felt her heart pounding which prompted her to check her BP which was 140/80 and HR 110 that stayed at 110. Advised patient that I will forward a message with her symptoms to Dr. Lovena Le for further review and recommendation and that someone would call back with any recommendations from Dr. Lovena Le. Pt thanked me for my call.

## 2017-03-17 NOTE — Telephone Encounter (Signed)
Discussed post RFA breakthrough episodes. Pt denies having dizziness or other acute symptoms at this time.  States that she has mostly experienced reported issues today. She will continue to monitor and call if begins/worsens. She understands Dr. Tanna Furry nurse, Doren Custard will review with him tomorrow and call her w/ recommendation/s. She thanks me for calling and explaining things.

## 2017-03-17 NOTE — Telephone Encounter (Signed)
nEW mESSAGE     140/80 Heart rate 110 laying down  Pt c/o Shortness Of Breath: STAT if SOB developed within the last 24 hours or pt is noticeably SOB on the phone  1. Are you currently SOB (can you hear that pt is SOB on the phone)? no  2. How long have you been experiencing SOB? Just today  3. Are you SOB when sitting or when up moving around? both  4. Are you currently experiencing any other symptoms? Feels funny , high heart rate

## 2017-03-18 MED ORDER — PROPRANOLOL HCL ER 60 MG PO CP24: 60.0000 mg | ORAL_CAPSULE | Freq: Every day | ORAL | 5 refills | Status: DC

## 2017-03-18 NOTE — Telephone Encounter (Signed)
Called, spoke with pt. Informed to start back on Propranolol, 1/2 dose of what pt was taking before. Informed pt start taking Propranolol 60 mg cap once daily. Pt requested medication to be sent to [facility] in [place]. Pt obtained BP and HR today -  BP 135/79 and HR 105. Informed to call our office with any questions or concerns, and if pt's HR remains elevated. Asked pt to call our office on Monday (03/21/17) to let us know how she is feeling. Pt agreed. Pt verbalized understanding and thanked me for calling.

## 2017-03-18 NOTE — Telephone Encounter (Signed)
New Message    Pt returning your call to discuss medication

## 2017-03-18 NOTE — Telephone Encounter (Signed)
Called, unable to reach pt. Left detailed message. Informed pt to please call pur office r/t starting back on Propranolol 1/2 dose (60 mg cap). Requested call back to discuss.

## 2017-03-21 ENCOUNTER — Telehealth: Payer: Self-pay | Admitting: Internal Medicine

## 2017-03-21 NOTE — Telephone Encounter (Signed)
WILL FORWARD TO DR  Lovena Le FOR  REVIEW .Adonis Housekeeper

## 2017-03-21 NOTE — Telephone Encounter (Signed)
New message      Pt just wanted to let you know everything is going great with this medication   propranolol ER (INDERAL LA) 60 MG 24 hr capsule

## 2017-04-08 ENCOUNTER — Ambulatory Visit: Payer: BC Managed Care – PPO | Admitting: Internal Medicine

## 2017-04-10 NOTE — Progress Notes (Signed)
Cardiology Office Note Date:  04/11/2017  Patient ID:  [name] [name], DOB 1971/08/25, MRN [id number] PCP:  Joycelyn Rua, MD  Cardiologist:  Dr. Tresa Endo Electrophysiologist: Dr. Ladona Ridgel   Chief Complaint: s/p SVT ablation  History of Present Illness: [name] [name] is a 47 y.o. female with history of SVT s/p ablation by Dr. Ladona Ridgel, 03/11/17.  Her BB was continued post procedure 2/2 HTN and Migraines, her CCB was stopped.  Post procedure she observed her BP low and HR slow and slef stopped her Propanolol, this resulted in faster rates and she was resumed on 1/2 dose.   She feels very well, 60mg  dose of propanolol has been great for her headaches, BP, she denies any palpitations, no recurrent symptoms of her SVT.   No dizziness, near syncope or syncope.  She had no procedure site complications.  Past Medical History:  Diagnosis Date  . Endometriosis   . Seasonal allergies   . SVT (supraventricular tachycardia) (HCC)     Past Surgical History:  Procedure Laterality Date  . BREAST LUMPECTOMY Left   . SVT ABLATION N/A 03/11/2017   Procedure: SVT Ablation;  Surgeon: Marinus Maw, MD;  Location: [facility] INVASIVE CV LAB;  Service: Cardiovascular;  Laterality: N/A;  . WRIST SURGERY Left    cyst removal    Current Outpatient Prescriptions  Medication Sig Dispense Refill  . aspirin 81 MG tablet Take 81 mg by mouth every evening.     . cetirizine (ZYRTEC) 10 MG tablet Take 10 mg by mouth every evening.     Marland Kitchen levonorgestrel-ethinyl estradiol (SEASONALE,INTROVALE,JOLESSA) 0.15-0.03 MG tablet Take 1 tablet by mouth every evening.     . meloxicam (MOBIC) 15 MG tablet Take 1 tablet (15 mg total) by mouth daily. 30 tablet 3  . propranolol ER (INDERAL LA) 60 MG 24 hr capsule Take 1 capsule (60 mg total) by mouth daily. 30 capsule 5  . SUMAtriptan (IMITREX) 50 MG tablet Take 1 tablet by mouth once. Take as directed for migraines     No current facility-administered medications for this visit.      Allergies:   Sulfa antibiotics and Adhesive [tape]   Social History:  The patient  reports that she has never smoked. She has never used smokeless tobacco. She reports that she drinks alcohol. She reports that she does not use drugs.   Family History:  The patient's family history includes Diabetes in her mother; Parkinson's disease in her father.  ROS:  Please see the history of present illness.   All other systems are reviewed and otherwise negative.   PHYSICAL EXAM:  VS:  BP 122/84   Pulse 73   Ht 6' (1.829 m)   Wt 219 lb (99.3 kg)   BMI 29.70 kg/m  BMI: Body mass index is 29.7 kg/m. Well nourished, well developed, in no acute distress  HEENT: normocephalic, atraumatic  Neck: no JVD, carotid bruits or masses, R IJ site well healed Cardiac:  RRR; no significant murmurs, no rubs, or gallops Lungs:  CTA b/l, no wheezing, rhonchi or rales  Abd: soft, nontender MS: no deformity or atrophy Ext: no edema, R goin site is well healed Skin: warm and dry, no rash Neuro:  No gross deficits appreciated Psych: euthymic mood, full affect   EKG:  Done today shows SR, 73bpm, PR , QRS 76ms, QTc  03/11/17: EPS/Ablation, Dr. Ladona Ridgel CONCLUSIONS:  1. Sinus rhythm upon presentation.  2. 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 Successful radiofrequency modification of the slow AV nodal pathway  4. No inducible arrhythmias following ablation.  5. No early apparent complications  06/20/15: TTE Study Conclusions - Left ventricle: The cavity size was normal. Wall thickness was   normal. Systolic function was normal. The estimated ejection   fraction was in the range of 55% to 60%. Left ventricular   diastolic function parameters were normal.  Recent Labs: 03/02/2017: ALT 16; BUN 22; Creatinine, Ser 0.98; Hemoglobin 14.3; Platelets 354; Potassium 4.7;  Sodium 139  No results found for requested labs within last 8760 hours.   CrCl cannot be calculated (Patient's most recent lab result is older than the maximum 21 days allowed.).   Wt Readings from Last 3 Encounters:  04/11/17 219 lb (99.3 kg)  03/11/17 215 lb (97.5 kg)  02/22/17 219 lb 9.6 oz (99.6 kg)     Other studies reviewed: Additional studies/records reviewed today include: summarized above  ASSESSMENT AND PLAN:  1. SVT ablated June 2018, Dr. Ladona Ridgel     Doing well, no site/procedure complications   Disposition: F/u with [place] PRN.  She sees her PMD for BP managament  Current medicines are reviewed at length with the patient today.  The patient did not have any concerns regarding medicines.  Judith Blonder, PA-C 04/11/2017 1:25 PM     [facility] [address] [place] [address] [phone] (office)  [phone] (fax)

## 2017-04-11 ENCOUNTER — Ambulatory Visit (INDEPENDENT_AMBULATORY_CARE_PROVIDER_SITE_OTHER): Payer: BC Managed Care – PPO | Admitting: Physician Assistant

## 2017-04-11 ENCOUNTER — Encounter (INDEPENDENT_AMBULATORY_CARE_PROVIDER_SITE_OTHER): Payer: Self-pay

## 2017-04-11 ENCOUNTER — Encounter: Payer: Self-pay | Admitting: Physician Assistant

## 2017-04-11 VITALS — BP 122/84 | HR 73 | Ht 72.0 in | Wt 219.0 lb

## 2017-04-11 DIAGNOSIS — I10 Essential (primary) hypertension: Secondary | ICD-10-CM | POA: Diagnosis not present

## 2017-04-11 DIAGNOSIS — I471 Supraventricular tachycardia: Secondary | ICD-10-CM

## 2017-04-11 NOTE — Patient Instructions (Signed)
Medication Instructions:   Your physician recommends that you continue on your current medications as directed. Please refer to the Current Medication list given to you today.   If you need a refill on your cardiac medications before your next appointment, please call your pharmacy.  Labwork:  NONE ORDERED  TODAY   Testing/Procedures: NONE ORDERED  TODAY    Follow-Up: CONTACT [facility] [phone] AS NEEDED FOR  ANY CARDIAC RELATED SYMPTOMS    Any Other Special Instructions Will Be Listed Below (If Applicable).

## 2017-04-12 ENCOUNTER — Ambulatory Visit: Payer: BC Managed Care – PPO | Admitting: Orthotics

## 2017-04-12 ENCOUNTER — Ambulatory Visit: Payer: BC Managed Care – PPO | Admitting: Podiatry

## 2017-05-17 ENCOUNTER — Encounter: Payer: Self-pay | Admitting: Podiatry

## 2017-05-17 ENCOUNTER — Ambulatory Visit (INDEPENDENT_AMBULATORY_CARE_PROVIDER_SITE_OTHER): Payer: BC Managed Care – PPO | Admitting: Podiatry

## 2017-05-17 DIAGNOSIS — M722 Plantar fascial fibromatosis: Secondary | ICD-10-CM | POA: Diagnosis not present

## 2017-05-17 NOTE — Progress Notes (Signed)
She presents today for follow-up of her plantar fasciitis stating that the right is doing very well however the left heel is just killing me. She is here today also to pick up her orthotics after having symptomatic for correction.  Objective: Pulses are palpable. She has pain on palpation medially. Tube of the left heel.  Assessment: Intractable plantar fasciitis left and right.  Plan: Reinject left heel today and put her back in her orthotics. Discussed possible need for surgical intervention.

## 2017-06-16 ENCOUNTER — Encounter: Payer: Self-pay | Admitting: Podiatry

## 2017-06-16 ENCOUNTER — Ambulatory Visit (INDEPENDENT_AMBULATORY_CARE_PROVIDER_SITE_OTHER): Payer: BC Managed Care – PPO | Admitting: Podiatry

## 2017-06-16 DIAGNOSIS — M722 Plantar fascial fibromatosis: Secondary | ICD-10-CM | POA: Diagnosis not present

## 2017-06-16 NOTE — Progress Notes (Signed)
She presents today for follow-up of her bilateral plantar fasciitis. We have injected her 3 times and she states she still not very well under control. She states that physical days to help much at hurts.  Objective: Vital signs are stable she is alert and oriented 3. Pain. Pulses are palpable. She retains pain on palpation of the plantar fasciitis insertion site left greater than right.  Assessment: Plantar fasciitis most likely left foot.  Plan: At this point prior to surgical intervention I recommended highly an MRI to verify this condition. I will follow up with her once her MRI is complete

## 2017-06-20 ENCOUNTER — Telehealth: Payer: Self-pay | Admitting: *Deleted

## 2017-06-20 DIAGNOSIS — M79672 Pain in left foot: Secondary | ICD-10-CM

## 2017-06-20 DIAGNOSIS — M722 Plantar fascial fibromatosis: Secondary | ICD-10-CM

## 2017-06-20 NOTE — Telephone Encounter (Addendum)
-----   Message from Rip Harbour, [facility] sent at 06/16/2017  4:26 PM EDT ----- Regarding: MRI MRI left heel - chronic plantar fasciitis left. 06/20/2017-Orders given to [facility] and faxed to [place].

## 2017-07-01 ENCOUNTER — Telehealth: Payer: Self-pay | Admitting: *Deleted

## 2017-07-01 NOTE — Telephone Encounter (Addendum)
[name] [name] denied MRI left heel, stating 6 months of conservative treatment. I informed Dr. [name] and he states continue conservative care at home, reappoint sooner if worsens, but will need to be reevaluated after 07/29/2017 for MRI. Left message to call office for information.07/11/2017-Pt called to check on the status of the PA for her MRI. I informed pt we had received the PA for the MRI and she could call [facility] [phone] to schedule. Faxed orders with copy of PA from [place]: [phone].

## 2017-07-04 ENCOUNTER — Telehealth: Payer: Self-pay

## 2017-07-04 ENCOUNTER — Other Ambulatory Visit: Payer: BC Managed Care – PPO

## 2017-07-04 NOTE — Telephone Encounter (Signed)
Spoke with patient informing her of denial of MRI, insurance states 6 months of conservative tx is required. She states she has been seeing [facility] for over 6 months, they have tried conservative treatment. I told her that I would call her insurance company and inform them of this to see if we can get MRI approved

## 2017-07-04 NOTE — Telephone Encounter (Signed)
Spoke with rep from [employer], they stated that an appeal would need to be done for coverag eof MRI. LVM in appeal's dept stating such request, awaiting a call back for request of more information. Informed patient

## 2017-07-05 ENCOUNTER — Telehealth: Payer: Self-pay

## 2017-07-05 NOTE — Telephone Encounter (Signed)
LVM for patient informing her on instructions and documents needed for her MRI appeal. I advised her to contact her orthopedic doctor and request chart notes for the past 6 months to be faxed to [employer] appeals at [phone]. The deadline to have all this information to them in 07/08/17 per [employer] appeal nurse.

## 2017-07-08 ENCOUNTER — Telehealth: Payer: Self-pay | Admitting: Podiatry

## 2017-07-08 NOTE — Telephone Encounter (Signed)
Tiffany with [employer] was calling to follow up on the appeal of pt's MRI. Stated information for the appeal can be faxed to [phone]. Asked you to call her back at [phone] ext [phone] and to please reference pt's member ID number when calling.

## 2017-07-16 ENCOUNTER — Other Ambulatory Visit: Payer: Self-pay | Admitting: Podiatry

## 2017-07-17 ENCOUNTER — Ambulatory Visit
Admission: RE | Admit: 2017-07-17 | Discharge: 2017-07-17 | Disposition: A | Discharge status: Home / Self Care | Payer: BC Managed Care – PPO | Source: Ambulatory Visit | Attending: Podiatry | Admitting: Podiatry

## 2017-07-25 ENCOUNTER — Telehealth: Payer: Self-pay | Admitting: Podiatry

## 2017-07-25 NOTE — Telephone Encounter (Signed)
I informed pt the MRI results were available and Dr. [name] would like her to make an appt. Pt states understanding and I transferred to schedulers.

## 2017-07-25 NOTE — Telephone Encounter (Signed)
I had an MRI done a little over a week ago and was calling to see if my results have come in yet. Please call me back at [phone]. Thank you.

## 2017-07-26 ENCOUNTER — Ambulatory Visit (INDEPENDENT_AMBULATORY_CARE_PROVIDER_SITE_OTHER): Payer: BC Managed Care – PPO | Admitting: Podiatry

## 2017-07-26 DIAGNOSIS — M722 Plantar fascial fibromatosis: Secondary | ICD-10-CM | POA: Diagnosis not present

## 2017-07-26 NOTE — Progress Notes (Signed)
She presents today for follow-up of her MRI results states that her left foot is still hurting she states it is worse by the end of the day.  Objective: Vital signs stable alert and oriented 3. Pulse are palpable. MRI does demonstrate findings consistent with plantar fasciitis thickening of the central band of the plantar fascia and low-grade tearing.  Assessment: Plantar fasciitis left foot.  Plan: We discussed the etiology pathology conservative versus surgical therapies. At this point we will consent her for endoscopic plantar fasciotomy of central medial bands of the plantar fascia left foot. We went over the consent form line by line number by number you her ample time to ask questions she saw fit regarding these procedures. I answered them to the best mobility in layman's terms. She understood it was amenable to it and signed off repair to the consent form. We did discuss the possible postop complications which may include but are not limited to postop pain bleeding swelling infection recurrence and need for further surgery overcorrection under correction loss of digit loss of limb loss of life. We will follow up with her in the near future for surgical intervention.

## 2017-07-26 NOTE — Patient Instructions (Signed)
Pre-Operative Instructions  Congratulations, you have decided to take an important step towards improving your quality of life.  You can be assured that the doctors and staff at [facility] will be with you every step of the way.  Here are some important things you should know:  1. Plan to be at the surgery center/hospital at least 1 (one) hour prior to your scheduled time, unless otherwise directed by the surgical center/hospital staff.  You must have a responsible adult accompany you, remain during the surgery and drive you home.  Make sure you have directions to the surgical center/hospital to ensure you arrive on time. 2. If you are having surgery at [facility] or [facility], you will need a copy of your medical history and physical form from your family physician within one month prior to the date of surgery. We will give you a form for your primary physician to complete.  3. We make every effort to accommodate the date you request for surgery.  However, there are times where surgery dates or times have to be moved.  We will contact you as soon as possible if a change in schedule is required.   4. No aspirin/ibuprofen for one week before surgery.  If you are on aspirin, any non-steroidal anti-inflammatory medications (Mobic, Aleve, Ibuprofen) should not be taken seven (7) days prior to your surgery.  You make take Tylenol for pain prior to surgery.  5. Medications - If you are taking daily heart and blood pressure medications, seizure, reflux, allergy, asthma, anxiety, pain or diabetes medications, make sure you notify the surgery center/hospital before the day of surgery so they can tell you which medications you should take or avoid the day of surgery. 6. No food or drink after midnight the night before surgery unless directed otherwise by surgical center/hospital staff. 7. No alcoholic beverages 24-hours prior to surgery.  No smoking 24-hours prior or 24-hours after  surgery. 8. Wear loose pants or shorts. They should be loose enough to fit over bandages, boots, and casts. 9. Don't wear slip-on shoes. Sneakers are preferred. 10. Bring your boot with you to the surgery center/hospital.  Also bring crutches or a walker if your physician has prescribed it for you.  If you do not have this equipment, it will be provided for you after surgery. 11. If you have not been contacted by the surgery center/hospital by the day before your surgery, call to confirm the date and time of your surgery. 12. Leave-time from work may vary depending on the type of surgery you have.  Appropriate arrangements should be made prior to surgery with your employer. 13. Prescriptions will be provided immediately following surgery by your doctor.  Fill these as soon as possible after surgery and take the medication as directed. Pain medications will not be refilled on weekends and must be approved by the doctor. 14. Remove nail polish on the operative foot and avoid getting pedicures prior to surgery. 15. Wash the night before surgery.  The night before surgery wash the foot and leg well with water and the antibacterial soap provided. Be sure to pay special attention to beneath the toenails and in between the toes.  Wash for at least three (3) minutes. Rinse thoroughly with water and dry well with a towel.  Perform this wash unless told not to do so by your physician.  Enclosed: 1 Ice pack (please put in freezer the night before surgery)   1 Hibiclens skin cleaner     Pre-op instructions  If you have any questions regarding the instructions, please do not hesitate to call our office.  [place]: [address], [place], [place] [address] -- [phone]  [place]: [address] [place], [place] [address] -- [phone]  [place]: [address]  [place], [place] [address] -- [phone]  [place]: [address], [address], [place], [place] [address] -- [phone]  Website:  [email or web address] 

## 2017-08-11 ENCOUNTER — Other Ambulatory Visit: Payer: Self-pay | Admitting: Podiatry

## 2017-08-23 ENCOUNTER — Other Ambulatory Visit: Payer: Self-pay | Admitting: Podiatry

## 2017-08-23 MED ORDER — CEPHALEXIN 500 MG PO CAPS: 500.0000 mg | ORAL_CAPSULE | Freq: Three times a day (TID) | ORAL | 0 refills | Status: DC

## 2017-08-23 MED ORDER — PROMETHAZINE HCL 25 MG PO TABS: 25.0000 mg | ORAL_TABLET | Freq: Three times a day (TID) | ORAL | 0 refills | Status: DC | PRN

## 2017-08-23 MED ORDER — HYDROMORPHONE HCL 4 MG PO TABS: 4.0000 mg | ORAL_TABLET | ORAL | 0 refills | Status: DC | PRN

## 2017-08-26 DIAGNOSIS — M722 Plantar fascial fibromatosis: Secondary | ICD-10-CM | POA: Diagnosis not present

## 2017-08-30 ENCOUNTER — Ambulatory Visit (INDEPENDENT_AMBULATORY_CARE_PROVIDER_SITE_OTHER): Payer: BC Managed Care – PPO | Admitting: Podiatry

## 2017-08-30 DIAGNOSIS — M722 Plantar fascial fibromatosis: Secondary | ICD-10-CM

## 2017-08-30 NOTE — Progress Notes (Signed)
She presents today 1 week status post endoscopic plantar fasciotomy left foot. She denies any A.  Objective: Dressing dressing intact was removed demonstrates no erythema edema cellulitis drainage or odor. Sutures are intact margins are well coapted.  Assessment: Well-healing EPF left.  Plan: Redressed with a light dressing today continue use of Cam Walker 24 hours a day until I see her next Thursday.

## 2017-09-06 ENCOUNTER — Telehealth: Payer: Self-pay | Admitting: *Deleted

## 2017-09-06 NOTE — Telephone Encounter (Signed)
DOS: 08/26/17 Endoscopic Plantar Fasciotomy,left

## 2017-09-08 ENCOUNTER — Ambulatory Visit (INDEPENDENT_AMBULATORY_CARE_PROVIDER_SITE_OTHER): Payer: BC Managed Care – PPO | Admitting: Podiatry

## 2017-09-08 ENCOUNTER — Encounter: Payer: Self-pay | Admitting: Podiatry

## 2017-09-08 DIAGNOSIS — M722 Plantar fascial fibromatosis: Secondary | ICD-10-CM

## 2017-09-08 MED ORDER — MELOXICAM 15 MG PO TABS: 15.0000 mg | ORAL_TABLET | Freq: Every day | ORAL | 3 refills | Status: DC

## 2017-09-10 NOTE — Progress Notes (Signed)
She presents today for follow-up of her endoscopic plantar fasciotomy left foot. She states that she is doing very well and happy with the outcome.  Objective: Vital signs are stable she's alert and oriented 3 surgical sites appear to be healing uneventfully.  Assessment: Well-healing endoscopic fasciotomy.  Plan: Continue use of the night splint.

## 2017-09-22 ENCOUNTER — Telehealth: Payer: Self-pay | Admitting: Podiatry

## 2017-09-22 NOTE — Telephone Encounter (Signed)
I'm a pt of Dr. Stephenie Acres and had surgery on 16 November for plantar fasciitis. I've been doing great until last night. All of a sudden last night the outside of my foot has been hurting really bad and if I step a certain way it shoots a shock up my leg. I haven't fallen or done anything that I'm aware of. I've been staying inside since this crazy weather. I don't know if this is part of the healing so I wanted to ask. My number is [phone]. Thanks. Bye.

## 2017-09-22 NOTE — Telephone Encounter (Signed)
I offered pt an appt with Dr. 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 I transferred to schedulers.

## 2017-09-23 ENCOUNTER — Encounter: Payer: Self-pay | Admitting: Podiatry

## 2017-09-23 ENCOUNTER — Ambulatory Visit (INDEPENDENT_AMBULATORY_CARE_PROVIDER_SITE_OTHER): Payer: BC Managed Care – PPO | Admitting: Podiatry

## 2017-09-23 DIAGNOSIS — M779 Enthesopathy, unspecified: Secondary | ICD-10-CM

## 2017-09-23 DIAGNOSIS — M722 Plantar fascial fibromatosis: Secondary | ICD-10-CM

## 2017-09-23 MED ORDER — METHYLPREDNISOLONE 4 MG PO TBPK: ORAL_TABLET | ORAL | 0 refills | Status: DC

## 2017-09-26 NOTE — Progress Notes (Signed)
Subjective: [name] [name] presents the office today for concerns of knee pain which started earlier this week to the left foot. She recently underwent a left endoscopic plantar fasciotomy wi states that she has been doing well.  She has had some tightness in generalth Dr. [name] and she she is been trying to work this out on her own but over the last couple days she started noticing pain more to the outside aspect of her foot she points along the lateral midfoot going into the fifth toe.  He denies any recent injury or trauma the pain is only to the bottom of the foot with walking.  There is no swelling or redness that she has noticed.  She has had no recent treatment for this but she went to have area checked. Denies any systemic complaints such as fevers, chills, nausea, vomiting. No acute changes since last appointment, and no other complaints at this time.   Objective: AAO x3, NAD DP/PT pulses palpable bilaterally, CRT less than 3 seconds Incision sites appear to be healing well and there is no tenderness to palpation surgical site.  There is mild tenderness along the lateral aspect of the midfoot more along the lateral band of the plantar fascia.  There is no tenderness the dorsal aspect of the foot there is no area pinpoint tenderness or pain to vibratory sensation.  There is no overlying edema, erythema, increase in warmth.  Equinus is present.  No open lesions or pre-ulcerative lesion identified. No pain with calf compression, swelling, warmth, erythema  Assessment: Tendinitis left foot likely result of compensation  Plan: -All treatment options discussed with the patient including all alternatives, risks, complications.  -At this point I prescribed a Medrol Dosepak.  Also discussed with her to return the CAM boot for the  next couple of days until the symptoms improve.  I think long-term she will benefit from physical therapy but I will have her follow-up with Dr. [name] at her scheduled  appointment and she still having issues at that point I would recommend physical therapy for her.  She agrees this plan.  I encouraged her to call any questions or concerns in the meantime. -Patient encouraged to call the office with any questions, concerns, change in symptoms.   [name] [name] DPM

## 2017-09-29 ENCOUNTER — Ambulatory Visit (INDEPENDENT_AMBULATORY_CARE_PROVIDER_SITE_OTHER): Payer: BC Managed Care – PPO | Admitting: Podiatry

## 2017-09-29 ENCOUNTER — Encounter: Payer: Self-pay | Admitting: Podiatry

## 2017-09-29 DIAGNOSIS — M722 Plantar fascial fibromatosis: Secondary | ICD-10-CM

## 2017-09-29 NOTE — Progress Notes (Signed)
She presents today and states that she is doing very well.  After taking the prednisone her heel and her legs are starting to do much better if she refers to endoscopic plantar fasciotomy of her left foot.  Objective: Vital signs are stable she is alert oriented x3 there is no reproducible pain on palpation of the leg knee or the heel.  Assessment: Slowly resolving cervical tenderness.  Plan: Follow-up with me in about 3 weeks.  We may need to consider physical therapy.  If this patient calls in requesting physical therapy is fine with me if we do an evaluation and treat providing them with the dates of surgery and the procedure.  I will then follow-up with her once physical therapy is complete.

## 2017-10-13 ENCOUNTER — Other Ambulatory Visit: Payer: BC Managed Care – PPO

## 2017-10-20 ENCOUNTER — Ambulatory Visit (INDEPENDENT_AMBULATORY_CARE_PROVIDER_SITE_OTHER): Payer: BC Managed Care – PPO | Admitting: Podiatry

## 2017-10-20 ENCOUNTER — Encounter: Payer: Self-pay | Admitting: Podiatry

## 2017-10-20 DIAGNOSIS — M722 Plantar fascial fibromatosis: Secondary | ICD-10-CM

## 2017-10-22 NOTE — Progress Notes (Signed)
She presents today for her final postop visit regarding her endoscopic plantar fasciotomy.  She states that she has some stiffness but overall is doing very well.  Objective: Vital signs are stable she is alert and oriented x3 no reproducible pain on palpation medial calcaneal tubercle of the left heel.  Assessment: Well-healing endoscopic plantar fasciotomy left.  Plan: Return to regular duties and activities follow-up with me on an as-needed basis.

## 2018-01-30 ENCOUNTER — Ambulatory Visit: Payer: BC Managed Care – PPO | Admitting: Podiatry

## 2018-01-30 ENCOUNTER — Encounter: Payer: Self-pay | Admitting: Podiatry

## 2018-01-30 ENCOUNTER — Ambulatory Visit (INDEPENDENT_AMBULATORY_CARE_PROVIDER_SITE_OTHER): Payer: BC Managed Care – PPO

## 2018-01-30 ENCOUNTER — Other Ambulatory Visit: Payer: Self-pay | Admitting: Podiatry

## 2018-01-30 DIAGNOSIS — M779 Enthesopathy, unspecified: Secondary | ICD-10-CM | POA: Diagnosis not present

## 2018-01-30 DIAGNOSIS — M778 Other enthesopathies, not elsewhere classified: Secondary | ICD-10-CM

## 2018-01-30 DIAGNOSIS — M722 Plantar fascial fibromatosis: Secondary | ICD-10-CM

## 2018-01-30 MED ORDER — DICLOFENAC SODIUM 75 MG PO TBEC: 75.0000 mg | DELAYED_RELEASE_TABLET | Freq: Two times a day (BID) | ORAL | 2 refills | Status: DC

## 2018-01-30 MED ORDER — TRIAMCINOLONE ACETONIDE 10 MG/ML IJ SUSP
10.0000 mg | Freq: Once | INTRAMUSCULAR | Status: AC
  Administered 2018-01-30: 10 mg

## 2018-02-01 NOTE — Progress Notes (Signed)
Subjective:   Patient ID: [name] [name], female   DOB: 47 y.o.   MRN: [id number]   HPI Patient presents stating she hurt her left foot and has been inflamed and making it hard to walk but she does not remember specific injury except for the fact that she took her kids on an extended trip and did a lot of walking   ROS      Objective:  Physical Exam  Neurovascular status intact incisions from endoscopic surgery approximate 6 months ago healed well with minimal plantar heel discomfort.  Patient has discomfort dorsum of left foot in the extensor tendon complex     Assessment:  Probability for strain of the extensor complex secondary to the recovery from the previous heel surgery with probable still moderate gait change     Plan:  Reviewed the importance of gradual increase in activity levels and did careful dorsal injection of the tendon complex 3 mg Kenalog 5 mg Xylocaine and advised on heat ice therapy and placed on diclofenac 75 mg twice daily  X-ray indicated that there is no signs of pathology from the bony structural position

## 2018-03-14 ENCOUNTER — Ambulatory Visit: Payer: BC Managed Care – PPO | Admitting: Podiatry

## 2018-03-14 ENCOUNTER — Encounter: Payer: Self-pay | Admitting: Podiatry

## 2018-03-14 DIAGNOSIS — T148XXA Other injury of unspecified body region, initial encounter: Secondary | ICD-10-CM

## 2018-03-15 NOTE — Progress Notes (Signed)
She presents today for follow-up of pain to the dorsal aspect of the left foot.  States that the left foot started bothering me the other week while we were hiking.  We are actually on a field trip.  She states that stepping down anything hurts the top of the foot around the third metatarsal about 2 inches from the joint which would be a midshaft.  She states that there is a little redness there is no swelling if they rubs against the shoes it hurts.  She was seen by Dr. Paulla Dolly a couple weeks ago where the injected the area and states that that did not help at all.   Objective: Vital signs are stable she is alert and oriented x3 she has pain on direct palpation of the mid diaphyseal region of the third metatarsal of the left foot.  There is mild overlying erythema minimal edema no ecchymosis no cellulitis no drainage.  I reviewed previous radiographs which did not demonstrate any type of fracture.  I requested to re-x-ray declined at this time.  Assessment more than likely this is his either a stress reaction or stress fracture mid diaphyseal third metatarsal.  Plan: In either event were going to place her in a Darco shoe I will follow-up with her in a couple to 3 weeks for another set of x-rays.

## 2018-04-12 ENCOUNTER — Ambulatory Visit: Payer: BC Managed Care – PPO | Admitting: Podiatry

## 2018-04-12 ENCOUNTER — Encounter: Payer: Self-pay | Admitting: Podiatry

## 2018-04-12 ENCOUNTER — Ambulatory Visit (INDEPENDENT_AMBULATORY_CARE_PROVIDER_SITE_OTHER): Payer: BC Managed Care – PPO

## 2018-04-12 DIAGNOSIS — T148XXA Other injury of unspecified body region, initial encounter: Secondary | ICD-10-CM

## 2018-04-12 NOTE — Progress Notes (Signed)
She presents today for follow-up of her painful forefoot left.  States that the bone itself is doing much better but the foot is still swelling.  Objective: Vital signs are stable alert oriented x3 mild tenderness mid diaphyseal region of the third metatarsal of the left foot.  Radiographs taken today do not demonstrate any external cortical interruption however more than likely there is a stress reaction to the third metatarsal.  The soft tissue is painful.  Assessment: Endosteal stress reaction.  Plan: Provided her with an compression anklet and instructed her to get back into her shoe gear.  Follow-up with her as needed.

## 2018-08-29 ENCOUNTER — Ambulatory Visit: Payer: BC Managed Care – PPO | Admitting: Podiatry

## 2018-08-29 DIAGNOSIS — L600 Ingrowing nail: Secondary | ICD-10-CM | POA: Diagnosis not present

## 2018-08-29 MED ORDER — NEOMYCIN-POLYMYXIN-HC 1 % OT SOLN: OTIC | 1 refills | Status: DC

## 2018-08-29 NOTE — Patient Instructions (Signed)

## 2018-08-30 NOTE — Progress Notes (Signed)
She presents today chief complaint of an ingrown toenail first right tibial border.  Objective: Vital signs are stable she is alert and oriented x3.  Erythema and edema with sharp incurvated nail margin tibial border hallux right exquisitely tender on palpation pulses remain palpable no open lesions or wounds.  Assessment: Ingrown toenail paronychia abscess hallux right tibial border.  Plan: Chemical matrixectomy was performed today after local anesthesia was administered patient tolerated procedure well.  Was provided with both oral and written home-going instruction for the care and soaking of the toe as well as a prescription for Cortisporin Otic to be applied twice daily after soaking.  I will follow-up with her in 2 weeks.

## 2018-09-14 ENCOUNTER — Ambulatory Visit: Payer: BC Managed Care – PPO | Admitting: *Deleted

## 2018-09-14 DIAGNOSIS — L600 Ingrowing nail: Secondary | ICD-10-CM

## 2018-09-14 NOTE — Patient Instructions (Signed)
Please continue to soak until there is no pain, no redness and no drainage. Bandage on during the day and off at night. Report any signs of symptoms of infection, examples are: increased pain, increased swelling or drainage, increased redness. Please call with any questions or concerns  Epsom Salt Soak Instructions    IF SOAKING IS STILL NECESSARY, START THIS 2 WEEKS AFTER INITIAL PROCEDURE   Place 1/4 cup of epsom salts in a quart of warm tap water.  Soak your foot or feet in the solution for 20 minutes twice a day until you notice the area has dried and a scab has formed. Continue to apply other medications to the area as directed by the doctor such as polysporin, neosporin or cortisporin drops.  IF YOUR SKIN BECOMES IRRITATED WHILE USING THESE INSTRUCTIONS, IT IS OKAY TO SWITCH TO  WHITE VINEGAR AND WATER. Or you may use antibacterial soap and water to keep the toe clean  Monitor for any signs/symptoms of infection. Call the office immediately if any occur or go directly to the emergency room. Call with any questions/concerns.

## 2019-01-10 IMAGING — MR MR HEEL *L* W/O CM
5 series · 40 of 40 positions shown · non-contrast
Comparison: Left foot x-rays dated February 25, 2017.

CLINICAL DATA: Constant left heel pain, concern for plantar
fasciitis.

EXAM:
MR OF THE LEFT HEEL WITHOUT CONTRAST
TECHNIQUE: Multiplanar, multisequence MR imaging of the left ankle was
performed. No intravenous contrast was administered.

[Series 4: T2 fat-sat · axial · 3.0mm · 0.56mm/px · z∈[-43,+74]mm · 9 of 31 slices shown (1 of 3)]
[im 1/31]
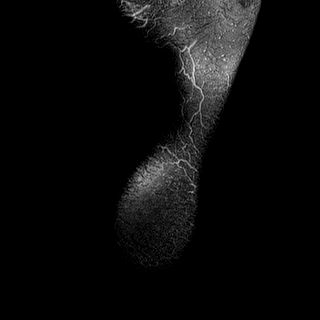
[im 4/31]
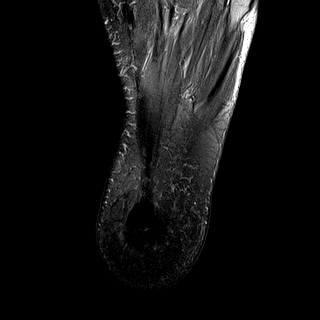
[im 8/31]
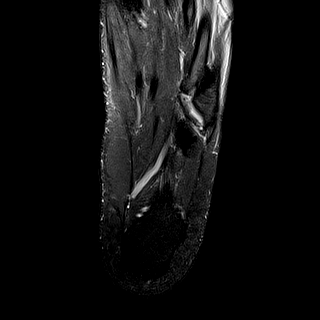
[im 12/31]
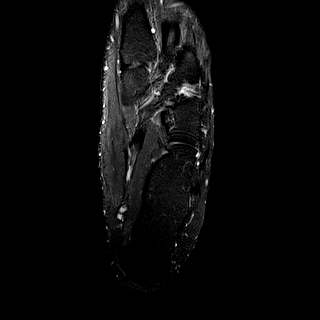
[im 16/31]
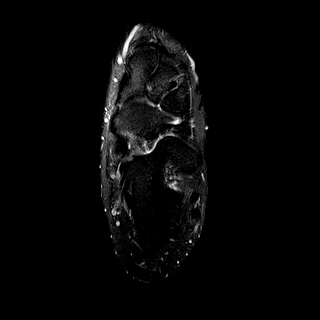
[im 19/31]
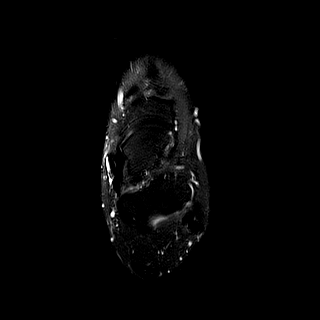
[im 23/31]
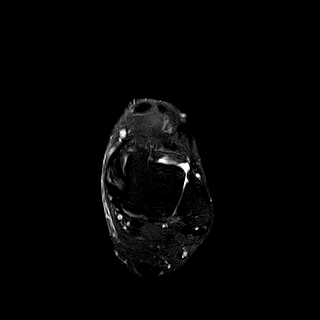
[im 27/31]
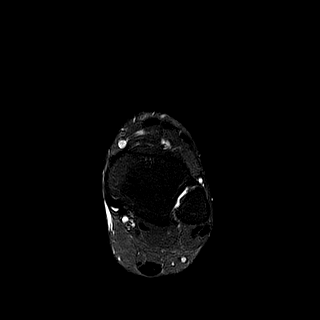
[im 31/31]
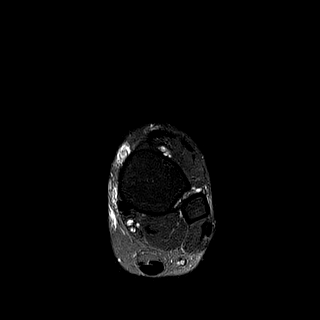

[Series 5: PD fat-sat · axial · 3.0mm · 0.56mm/px · z∈[-43,+74]mm · 9 of 31 slices shown]
[im 1/31]
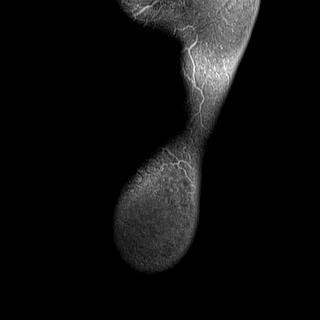
[im 4/31]
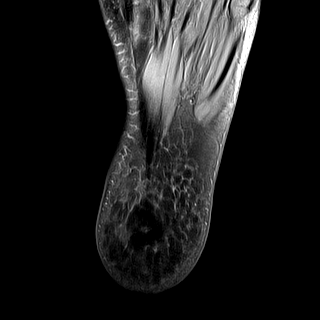
[im 8/31]
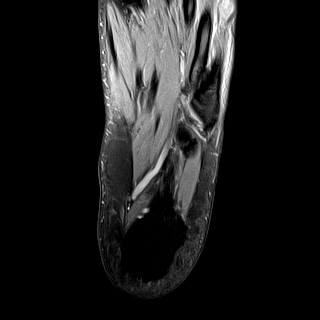
[im 12/31]
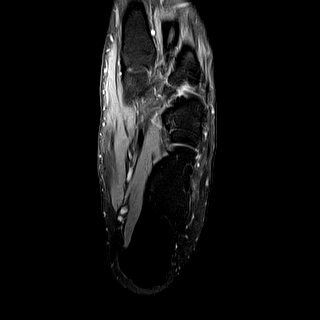
[im 16/31]
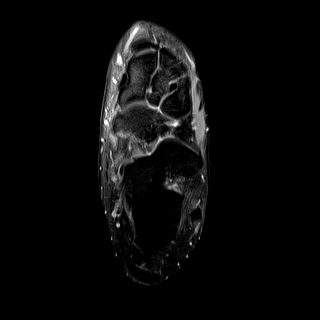
[im 19/31]
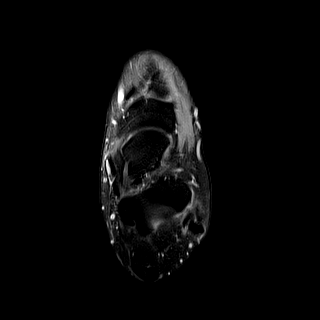
[im 23/31]
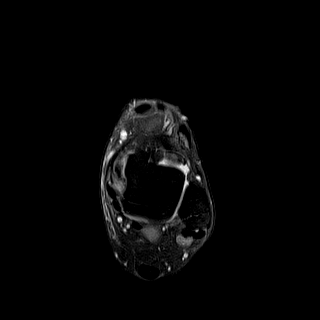
[im 27/31]
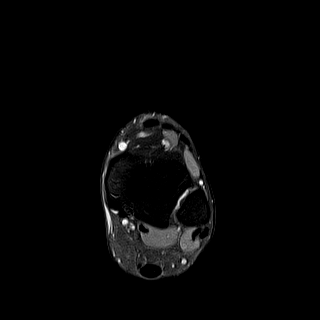
[im 31/31]
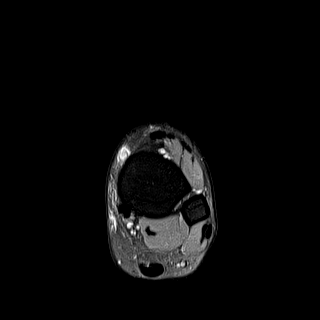

[Series 6: T2 fat-sat · sagittal · 3.0mm · 0.56mm/px · 7 of 22 slices shown (2 of 3)]
[im 1/22]
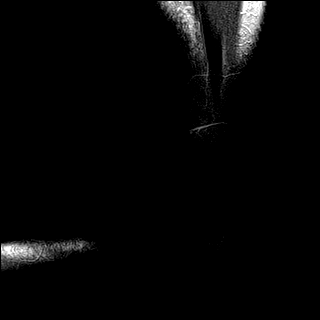
[im 4/22]
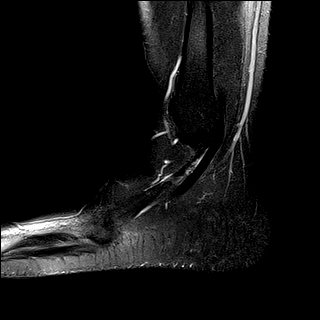
[im 8/22]
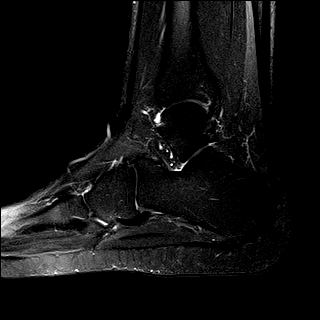
[im 11/22]
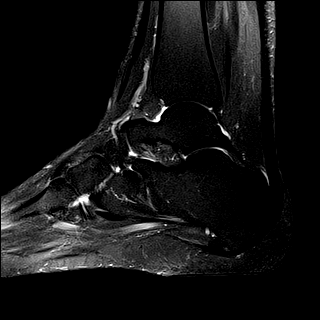
[im 15/22]
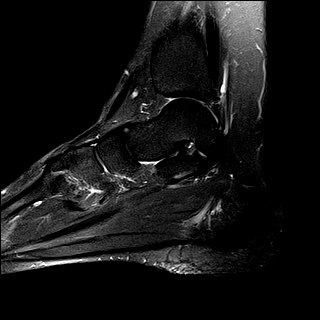
[im 18/22]
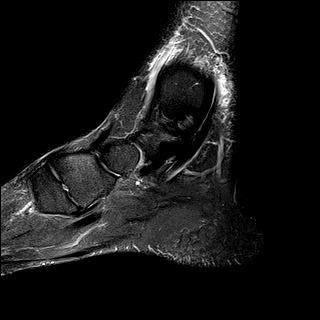
[im 22/22]
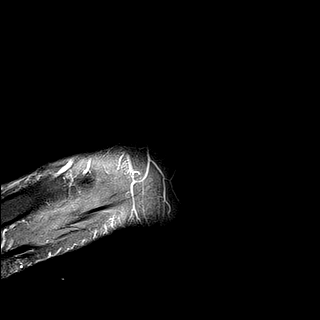

[Series 7: T1 · sagittal · 3.0mm · 0.56mm/px · 7 of 22 slices shown]
[im 1/22]
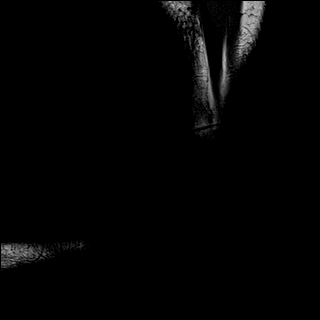
[im 4/22]
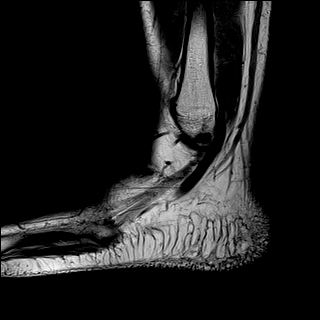
[im 8/22]
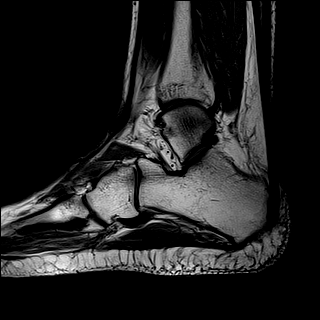
[im 11/22]
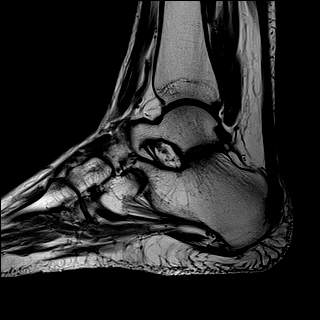
[im 15/22]
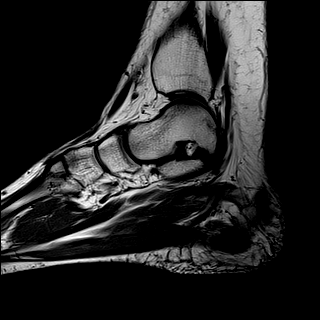
[im 18/22]
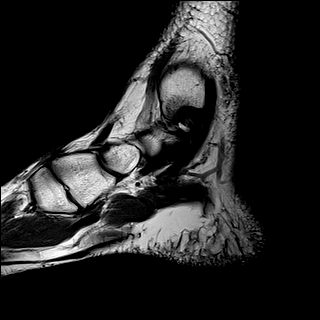
[im 22/22]
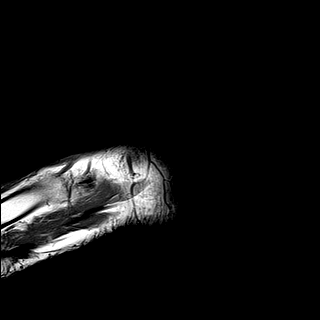

[Series 8: T2 fat-sat · coronal · 3.5mm · 0.56mm/px · 8 of 28 slices shown (3 of 3)]
[im 1/28]
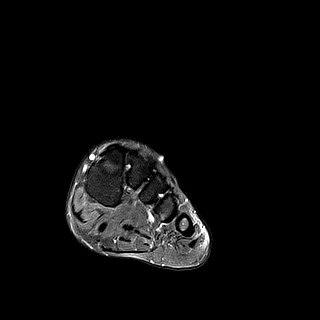
[im 4/28]
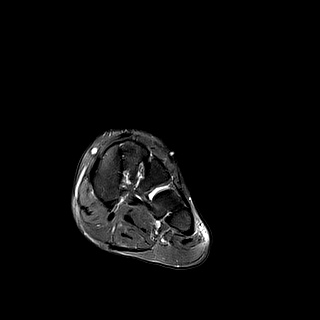
[im 8/28]
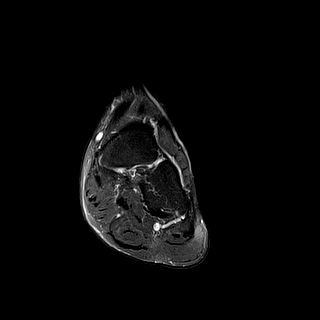
[im 12/28]
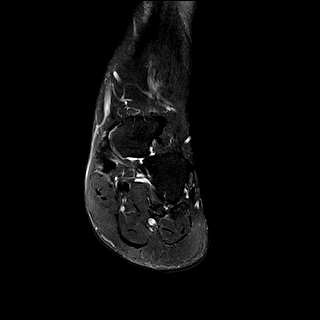
[im 16/28]
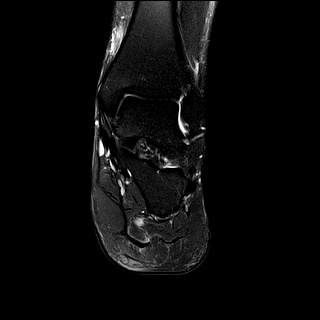
[im 20/28]
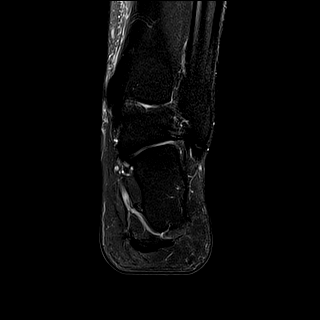
[im 24/28]
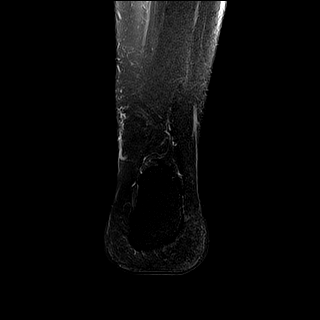
[im 28/28]
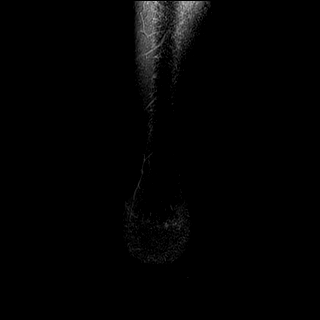

[40 of 40 positions shown; findings below may reference images not displayed]

FINDINGS: TENDONS

Peroneal: Intact and normally positioned. Mild peroneus longus
tendinosis just proximal to an os peroneum.

Posteromedial: Intact and normally positioned. Mild posterior
tibialis tenosynovitis.

Anterior: Intact and normally positioned.

Achilles: Intact.  Trace fluid in the retrocalcaneal bursa.

Plantar Fascia: Thickened central band with tiny fluid signal at its
calcaneal origin (series 6, image 12), consistent with low-grade
partial tearing.

LIGAMENTS

Lateral: The anterior and posterior talofibular and calcaneofibular
ligaments are intact.

Medial: The deltoid and visualized portions of the spring ligament
appear intact.

CARTILAGE AND BONES

Ankle Joint: No significant ankle joint effusion. The talar dome and
tibial plafond are intact.

Subtalar Joints/Sinus Tarsi: Unremarkable.

Bones: No fracture. Trace edema in the calcaneus at the attachment
of the plantar fascia. Plantar enthesophyte. Os peroneum.

Other: No significant soft tissue findings.
IMPRESSION: 1. Findings consistent with plantar fasciitis, with thickening of
the central band and low-grade partial tearing. Subtle adjacent
reactive marrow edema at the calcaneal attachment.
2. Mild posterior tibialis tenosynovitis.

## 2019-03-15 ENCOUNTER — Encounter (HOSPITAL_COMMUNITY): Payer: Self-pay

## 2019-03-15 ENCOUNTER — Emergency Department (HOSPITAL_COMMUNITY)
Admission: EM | Admit: 2019-03-15 | Discharge: 2019-03-15 | Disposition: A | Discharge status: Home / Self Care | Payer: BC Managed Care – PPO | Attending: Emergency Medicine | Admitting: Emergency Medicine

## 2019-03-15 ENCOUNTER — Other Ambulatory Visit: Payer: Self-pay

## 2019-03-15 DIAGNOSIS — I10 Essential (primary) hypertension: Secondary | ICD-10-CM | POA: Insufficient documentation

## 2019-03-15 DIAGNOSIS — Z7982 Long term (current) use of aspirin: Secondary | ICD-10-CM | POA: Insufficient documentation

## 2019-03-15 DIAGNOSIS — Z79899 Other long term (current) drug therapy: Secondary | ICD-10-CM | POA: Insufficient documentation

## 2019-03-15 DIAGNOSIS — R55 Syncope and collapse: Secondary | ICD-10-CM

## 2019-03-15 DIAGNOSIS — T675XXA Heat exhaustion, unspecified, initial encounter: Secondary | ICD-10-CM | POA: Diagnosis not present

## 2019-03-15 LAB — CBC WITH DIFFERENTIAL/PLATELET
Abs Immature Granulocytes: 0.08 10*3/uL — ABNORMAL HIGH (ref 0.00–0.07)
Basophils Absolute: 0 10*3/uL (ref 0.0–0.1)
Basophils Relative: 0 %
Eosinophils Absolute: 0.1 10*3/uL (ref 0.0–0.5)
Eosinophils Relative: 1 %
HCT: 47.3 % — ABNORMAL HIGH (ref 36.0–46.0)
Hemoglobin: 15.4 g/dL — ABNORMAL HIGH (ref 12.0–15.0)
Immature Granulocytes: 1 %
Lymphocytes Relative: 16 %
Lymphs Abs: 2.3 10*3/uL (ref 0.7–4.0)
MCH: 30.9 pg (ref 26.0–34.0)
MCHC: 32.6 g/dL (ref 30.0–36.0)
MCV: 95 fL (ref 80.0–100.0)
Monocytes Absolute: 1 10*3/uL (ref 0.1–1.0)
Monocytes Relative: 7 %
Neutro Abs: 11.4 10*3/uL — ABNORMAL HIGH (ref 1.7–7.7)
Neutrophils Relative %: 75 %
Platelets: 246 10*3/uL (ref 150–400)
RBC: 4.98 MIL/uL (ref 3.87–5.11)
RDW: 13.2 % (ref 11.5–15.5)
WBC: 14.9 10*3/uL — ABNORMAL HIGH (ref 4.0–10.5)
nRBC: 0 % (ref 0.0–0.2)

## 2019-03-15 LAB — BASIC METABOLIC PANEL
Anion gap: 11 (ref 5–15)
BUN: 15 mg/dL (ref 6–20)
CO2: 20 mmol/L — ABNORMAL LOW (ref 22–32)
Calcium: 9.2 mg/dL (ref 8.9–10.3)
Chloride: 107 mmol/L (ref 98–111)
Creatinine, Ser: 1.09 mg/dL — ABNORMAL HIGH (ref 0.44–1.00)
GFR calc Af Amer: >60 mL/min (ref >60)
GFR calc non Af Amer: >60 mL/min (ref >60)
Glucose, Bld: 95 mg/dL (ref 70–99)
Potassium: 4.3 mmol/L (ref 3.5–5.1)
Sodium: 138 mmol/L (ref 135–145)

## 2019-03-15 LAB — I-STAT BETA HCG BLOOD, ED (MC, WL, AP ONLY): I-stat hCG, quantitative: <5 m[IU]/mL (ref <5)

## 2019-03-15 LAB — CBG MONITORING, ED: Glucose-Capillary: 94 mg/dL (ref 70–99)

## 2019-03-15 MED ORDER — SODIUM CHLORIDE 0.9 % IV BOLUS
1000.0000 mL | Freq: Once | INTRAVENOUS | Status: AC
  Administered 2019-03-15: 1000 mL via INTRAVENOUS

## 2019-03-15 MED ORDER — SODIUM CHLORIDE 0.9 % IV SOLN: INTRAVENOUS | Status: DC

## 2019-03-15 NOTE — ED Notes (Signed)
Bed: WA09 Expected date:  Expected time:  Means of arrival:  Comments: EMS near syncope

## 2019-03-15 NOTE — ED Provider Notes (Signed)
[facility] DEPT Provider Note   CSN: [id number] Arrival date & time: 03/15/19  1858    History   Chief Complaint Chief Complaint  Patient presents with  . Loss of Consciousness    HPI [name] [name] is a 48 y.o. female.     The history is provided by the patient and medical records. No language interpreter was used.  Loss of Consciousness     48 year old female with history of SVT brought here via EMS for evaluation of near syncope.  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  Today [name] [name] stands outside in the heat for approximately 3hours to help with class graduation festivities.  [name] [name] reported feeling lightheadedness, dizzy and sat down and had a near syncopal episode.  [name] [name] recalls being very hot as if [name] [name] was going to pass out.  [name] [name] admits that [name] [name] did not drink much water today.  Does mention that [name] [name] has been having pain to her abdomen related to her kidney stone.  [name] [name] did have remote history of SVT status post ablation in 2018.  [name] [name] Denies Any chest pain, trouble breathing, heart palpitation.  [name] [name] denies fevers or chills.  Past Medical History:  Diagnosis Date  . Endometriosis   . Seasonal allergies   . SVT (supraventricular tachycardia) [facility])     Patient Active Problem List   Diagnosis Date Noted  . Migraine without aura and without status migrainosus, not intractable 02/09/2016  . SVT (supraventricular tachycardia) ([place]) 05/31/2015  . History of migraine headaches 05/31/2015  . Essential hypertension 05/31/2015    Past Surgical History:  Procedure Laterality Date  . BREAST LUMPECTOMY Left   . SVT ABLATION N/A 03/11/2017   Procedure: SVT Ablation;  Surgeon: Evans Lance, MD;  Location: [facility] [facility];  Service: Cardiovascular;  Laterality: N/A;  . WRIST SURGERY Left    cyst removal     OB History   No obstetric history on file.      Home Medications    Prior to Admission medications   Medication Sig Start Date End Date Taking?  Authorizing Provider  aspirin 81 MG tablet Take 81 mg by mouth every evening.     [provider]  cetirizine (ZYRTEC) 10 MG tablet Take 10 mg by mouth every evening.     [provider]  diclofenac (VOLTAREN) 75 MG EC tablet TAKE 1 TABLET(75 MG) BY MOUTH TWICE DAILY 01/31/18   Wallene Huh, DPM  levonorgestrel-ethinyl estradiol (SEASONALE,INTROVALE,JOLESSA) 0.15-0.03 MG tablet Take 1 tablet by mouth every evening.     [provider]  meloxicam (MOBIC) 15 MG tablet Take 1 tablet (15 mg total) by mouth daily. 09/08/17   Hyatt, Max T, DPM  NEOMYCIN-POLYMYXIN-HYDROCORTISONE (CORTISPORIN) 1 % SOLN OTIC solution Apply 1-2 drops to toe BID after soaking 08/29/18   Hyatt, Max T, DPM  nitrofurantoin, macrocrystal-monohydrate, (MACROBID) 100 MG capsule Take 100 mg by mouth 2 (two) times daily. 08/11/18   [provider]  propranolol ER (INDERAL LA) 60 MG 24 hr capsule Take 1 capsule (60 mg total) by mouth daily. 03/18/17   Evans Lance, MD  SUMAtriptan (IMITREX) 50 MG tablet Take 1 tablet by mouth once. Take as directed for migraines 01/11/17   [provider]    Family History Family History  Problem Relation Age of Onset  . Diabetes Mother        did of complications of C-Diff  . Parkinson's disease Father     Social History Social  History   Tobacco Use  . Smoking status: Never Smoker  . Smokeless tobacco: Never Used  Substance Use Topics  . Alcohol use: Yes    Comment: occ  . Drug use: No     Allergies   Sulfa antibiotics and Adhesive [tape]   Review of Systems Review of Systems  Cardiovascular: Positive for syncope.  All other systems reviewed and are negative.    Physical Exam Updated Vital Signs BP 130/80 (BP Location: Right Arm)   Pulse 85   Temp 98.1 F (36.7 C) (Oral)   Resp 10   Ht 5' 11.75" (1.822 m)   Wt 99.8 kg   SpO2 98%   BMI 30.05 kg/m   Physical Exam Vitals signs and nursing note reviewed.   Constitutional:      General: [name] [name] is not in acute distress.    Appearance: [name] [name] is well-developed.  HENT:     Head: Atraumatic.     Mouth/Throat:     Mouth: Mucous membranes are moist.  Eyes:     Conjunctiva/sclera: Conjunctivae normal.  Neck:     Musculoskeletal: Neck supple.  Cardiovascular:     Rate and Rhythm: Normal rate and regular rhythm.  Pulmonary:     Effort: Pulmonary effort is normal.     Breath sounds: Normal breath sounds.  Abdominal:     Palpations: Abdomen is soft.     Tenderness: There is abdominal tenderness (No abdominal tenderness without guarding or rebound tenderness.).  Skin:    Capillary Refill: Capillary refill takes less than 2 seconds.     Findings: No rash.     Comments: Sunburned skin noted throughout neck and arms.  Neurological:     General: No focal deficit present.     Mental Status: [name] [name] is alert and oriented to person, place, and time.     GCS: GCS eye subscore is 4. GCS verbal subscore is 5. GCS motor subscore is 6.     Cranial Nerves: Cranial nerves are intact.     Sensory: Sensation is intact.     Motor: Motor function is intact.  Psychiatric:        Mood and Affect: Mood normal.      ED Treatments / Results  Labs (all labs ordered are listed, but only abnormal results are displayed) Labs Reviewed  BASIC METABOLIC PANEL - Abnormal; Notable for the following components:      Result Value   CO2 20 (*)    Creatinine, Ser 1.09 (*)    All other components within normal limits  CBC WITH DIFFERENTIAL/PLATELET - Abnormal; Notable for the following components:   WBC 14.9 (*)    Hemoglobin 15.4 (*)    HCT 47.3 (*)    Neutro Abs 11.4 (*)    Abs Immature Granulocytes 0.08 (*)    All other components within normal limits  CBG MONITORING, ED  I-STAT BETA HCG BLOOD, ED ([facility], [facility], [facility] ONLY)    EKG None   Date: 03/15/2019  Rate: 93  Rhythm: normal sinus rhythm  QRS Axis: normal  Intervals: normal  ST/T Wave abnormalities: normal   Conduction Disutrbances: none  Narrative Interpretation:   Old EKG Reviewed: No significant changes noted     Radiology No results found.  Procedures Procedures (including critical care time)  Medications Ordered in ED Medications  sodium chloride 0.9 % bolus 1,000 mL (0 mLs Intravenous Stopped 03/15/19 2251)    And  0.9 %  sodium chloride infusion (has no administration in time  range)     Initial Impression / Assessment and Plan / ED Course  I have reviewed the triage vital signs and the nursing notes.  Pertinent labs & imaging results that were available during my care of the patient were reviewed by me and considered in my medical decision making (see chart for details).        BP 130/80 (BP Location: Right Arm)   Pulse 85   Temp 98.1 F (36.7 C) (Oral)   Resp 10   Ht 5' 11.75" (1.822 m)   Wt 99.8 kg   SpO2 98%   BMI 30.05 kg/m    Final Clinical Impressions(s) / ED Diagnoses   Final diagnoses:  Near syncope  Heat exhaustion, initial encounter    ED Discharge Orders    None     8:00 PM Patient here for near syncopal episode after [name] [name] stand outside in the heat for 3-4 hrs today.  Suspect episodes likely secondary to dehydration and heat exhaustion.  [name] [name] is mentating appropriately.  History of SVT status post SVT ablation.  EKG today without concerning arrhythmia.  IV fluid given, work-up initiated.  10:59 PM Labs are reassuring.  Mild elevated white blood count of 14.9 likely stress to margination.  Evidence of dehydration with concentrated hemoglobin of 13.4 and a creatinine is 1.09.  After IV fluid and rest, patient felt better and ambulate without difficulty.  [name] [name] is stable for discharge.  Encourage hydrated.  Return precaution discussed.   Domenic Moras, PA-C 03/15/19 2574    Sherwood Gambler, MD 03/22/19 [phone]

## 2019-03-15 NOTE — ED Notes (Signed)
Pt ambulated in hallway with steady gait.

## 2019-03-15 NOTE — ED Notes (Signed)
Pt able to ambulate with a steady gait, and no complaints.

## 2019-03-15 NOTE — ED Triage Notes (Signed)
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 She walked away, sat down and eventually laid down on the ground. No complete LOC, pt has only had 32 oz of water total today. Pt complaining of pain in right lower quadrant, says she currently has a kidney stone.

## 2019-04-06 ENCOUNTER — Other Ambulatory Visit: Payer: Self-pay | Admitting: Urology

## 2019-05-08 ENCOUNTER — Other Ambulatory Visit (HOSPITAL_COMMUNITY)
Admission: RE | Admit: 2019-05-08 | Discharge: 2019-05-08 | Disposition: A | Discharge status: Home / Self Care | Payer: BC Managed Care – PPO | Source: Ambulatory Visit | Attending: Urology | Admitting: Urology

## 2019-05-08 DIAGNOSIS — Z20828 Contact with and (suspected) exposure to other viral communicable diseases: Secondary | ICD-10-CM | POA: Diagnosis present

## 2019-05-08 LAB — SARS CORONAVIRUS 2 (TAT 6-24 HRS): SARS Coronavirus 2: NEGATIVE

## 2019-05-09 ENCOUNTER — Encounter (HOSPITAL_BASED_OUTPATIENT_CLINIC_OR_DEPARTMENT_OTHER): Payer: Self-pay | Admitting: *Deleted

## 2019-05-09 ENCOUNTER — Other Ambulatory Visit: Payer: Self-pay

## 2019-05-09 NOTE — Progress Notes (Signed)
Spoke w/ pt via phone for pre-op interview.  Npo after mn.  Arrive at 0730.  Needs istat and urine preg.  Current ekg in chart and epic.  Pt had coivd test done yesterday.

## 2019-05-11 ENCOUNTER — Encounter (HOSPITAL_BASED_OUTPATIENT_CLINIC_OR_DEPARTMENT_OTHER): Payer: Self-pay | Admitting: *Deleted

## 2019-05-11 ENCOUNTER — Encounter (HOSPITAL_BASED_OUTPATIENT_CLINIC_OR_DEPARTMENT_OTHER)
Admission: RE | Disposition: A | Discharge status: Home / Self Care | Payer: Self-pay | Source: Home / Self Care | Attending: Urology

## 2019-05-11 ENCOUNTER — Other Ambulatory Visit: Payer: Self-pay

## 2019-05-11 ENCOUNTER — Ambulatory Visit (HOSPITAL_BASED_OUTPATIENT_CLINIC_OR_DEPARTMENT_OTHER): Payer: BC Managed Care – PPO | Admitting: Anesthesiology

## 2019-05-11 ENCOUNTER — Ambulatory Visit (HOSPITAL_BASED_OUTPATIENT_CLINIC_OR_DEPARTMENT_OTHER)
Admission: RE | Admit: 2019-05-11 | Discharge: 2019-05-11 | Disposition: A | Discharge status: Home / Self Care | Payer: BC Managed Care – PPO | Attending: Urology | Admitting: Urology

## 2019-05-11 DIAGNOSIS — Z7982 Long term (current) use of aspirin: Secondary | ICD-10-CM | POA: Diagnosis not present

## 2019-05-11 DIAGNOSIS — Z79899 Other long term (current) drug therapy: Secondary | ICD-10-CM | POA: Diagnosis not present

## 2019-05-11 DIAGNOSIS — Z882 Allergy status to sulfonamides status: Secondary | ICD-10-CM | POA: Diagnosis not present

## 2019-05-11 DIAGNOSIS — N202 Calculus of kidney with calculus of ureter: Secondary | ICD-10-CM | POA: Insufficient documentation

## 2019-05-11 DIAGNOSIS — I10 Essential (primary) hypertension: Secondary | ICD-10-CM | POA: Insufficient documentation

## 2019-05-11 DIAGNOSIS — N200 Calculus of kidney: Secondary | ICD-10-CM

## 2019-05-11 HISTORY — DX: Calculus of ureter: N20.1

## 2019-05-11 HISTORY — DX: Migraine, unspecified, not intractable, without status migrainosus: G43.909

## 2019-05-11 HISTORY — DX: Personal history of other diseases of the circulatory system: Z86.79

## 2019-05-11 HISTORY — DX: Personal history of urinary calculi: Z87.442

## 2019-05-11 HISTORY — DX: Essential (primary) hypertension: I10

## 2019-05-11 HISTORY — PX: HOLMIUM LASER APPLICATION: SHX5852

## 2019-05-11 HISTORY — PX: URETEROSCOPY WITH HOLMIUM LASER LITHOTRIPSY: SHX6645

## 2019-05-11 LAB — POCT I-STAT 4, (NA,K, GLUC, HGB,HCT)
Glucose, Bld: 97 mg/dL (ref 70–99)
HCT: 49 % — ABNORMAL HIGH (ref 36.0–46.0)
Hemoglobin: 16.7 g/dL — ABNORMAL HIGH (ref 12.0–15.0)
Potassium: 5.6 mmol/L — ABNORMAL HIGH (ref 3.5–5.1)
Sodium: 137 mmol/L (ref 135–145)

## 2019-05-11 LAB — POCT PREGNANCY, URINE: Preg Test, Ur: NEGATIVE

## 2019-05-11 SURGERY — URETEROSCOPY, WITH LITHOTRIPSY USING HOLMIUM LASER
Anesthesia: General | Site: Renal | Laterality: Left

## 2019-05-11 MED ORDER — LACTATED RINGERS IV SOLN
INTRAVENOUS | Status: DC
  Administered 2019-05-11: 08:00:00 via INTRAVENOUS
  Filled 2019-05-11: qty 1000

## 2019-05-11 MED ORDER — SODIUM CHLORIDE 0.9 % IR SOLN
Status: DC | PRN
  Administered 2019-05-11 (×2): 3000 mL

## 2019-05-11 MED ORDER — FENTANYL CITRATE (PF) 100 MCG/2ML IJ SOLN
25.0000 ug | INTRAMUSCULAR | Status: DC | PRN
  Filled 2019-05-11: qty 1

## 2019-05-11 MED ORDER — PROPOFOL 10 MG/ML IV BOLUS
INTRAVENOUS | Status: DC | PRN
  Administered 2019-05-11: 200 mg via INTRAVENOUS

## 2019-05-11 MED ORDER — FENTANYL CITRATE (PF) 100 MCG/2ML IJ SOLN
INTRAMUSCULAR | Status: AC
  Filled 2019-05-11: qty 2

## 2019-05-11 MED ORDER — KETOROLAC TROMETHAMINE 30 MG/ML IJ SOLN
INTRAMUSCULAR | Status: AC
  Filled 2019-05-11: qty 1

## 2019-05-11 MED ORDER — HYDROCODONE-ACETAMINOPHEN 10-325 MG PO TABS: 1.0000 | ORAL_TABLET | ORAL | 0 refills | Status: DC | PRN

## 2019-05-11 MED ORDER — MIDAZOLAM HCL 2 MG/2ML IJ SOLN
INTRAMUSCULAR | Status: AC
  Filled 2019-05-11: qty 2

## 2019-05-11 MED ORDER — DEXAMETHASONE SODIUM PHOSPHATE 10 MG/ML IJ SOLN
INTRAMUSCULAR | Status: AC
  Filled 2019-05-11: qty 1

## 2019-05-11 MED ORDER — CIPROFLOXACIN IN D5W 400 MG/200ML IV SOLN
INTRAVENOUS | Status: AC
  Filled 2019-05-11: qty 200

## 2019-05-11 MED ORDER — OXYCODONE HCL 5 MG PO TABS
5.0000 mg | ORAL_TABLET | Freq: Once | ORAL | Status: DC | PRN
  Filled 2019-05-11: qty 1

## 2019-05-11 MED ORDER — FENTANYL CITRATE (PF) 100 MCG/2ML IJ SOLN
INTRAMUSCULAR | Status: DC | PRN
  Administered 2019-05-11: 25 ug via INTRAVENOUS
  Administered 2019-05-11 (×3): 50 ug via INTRAVENOUS
  Administered 2019-05-11: 25 ug via INTRAVENOUS

## 2019-05-11 MED ORDER — KETOROLAC TROMETHAMINE 30 MG/ML IJ SOLN
INTRAMUSCULAR | Status: DC | PRN
  Administered 2019-05-11: 30 mg via INTRAVENOUS

## 2019-05-11 MED ORDER — PHENAZOPYRIDINE HCL 200 MG PO TABS: 200.0000 mg | ORAL_TABLET | Freq: Three times a day (TID) | ORAL | 0 refills | Status: DC | PRN

## 2019-05-11 MED ORDER — OXYCODONE HCL 5 MG/5ML PO SOLN
5.0000 mg | Freq: Once | ORAL | Status: DC | PRN
  Filled 2019-05-11: qty 5

## 2019-05-11 MED ORDER — ONDANSETRON HCL 4 MG/2ML IJ SOLN
4.0000 mg | Freq: Four times a day (QID) | INTRAMUSCULAR | Status: DC | PRN
  Filled 2019-05-11: qty 2

## 2019-05-11 MED ORDER — ONDANSETRON HCL 4 MG/2ML IJ SOLN
INTRAMUSCULAR | Status: DC | PRN
  Administered 2019-05-11: 4 mg via INTRAVENOUS

## 2019-05-11 MED ORDER — IOHEXOL 300 MG/ML  SOLN
INTRAMUSCULAR | Status: DC | PRN
  Administered 2019-05-11: 10 mL

## 2019-05-11 MED ORDER — PROPOFOL 10 MG/ML IV BOLUS
INTRAVENOUS | Status: AC
  Filled 2019-05-11: qty 20

## 2019-05-11 MED ORDER — MIDAZOLAM HCL 2 MG/2ML IJ SOLN
INTRAMUSCULAR | Status: DC | PRN
  Administered 2019-05-11: 2 mg via INTRAVENOUS

## 2019-05-11 MED ORDER — CIPROFLOXACIN IN D5W 400 MG/200ML IV SOLN
400.0000 mg | Freq: Once | INTRAVENOUS | Status: AC
  Administered 2019-05-11: 400 mg via INTRAVENOUS
  Filled 2019-05-11: qty 200

## 2019-05-11 MED ORDER — DEXAMETHASONE SODIUM PHOSPHATE 10 MG/ML IJ SOLN
INTRAMUSCULAR | Status: DC | PRN
  Administered 2019-05-11: 5 mg via INTRAVENOUS

## 2019-05-11 MED ORDER — ONDANSETRON HCL 4 MG/2ML IJ SOLN
INTRAMUSCULAR | Status: AC
  Filled 2019-05-11: qty 2

## 2019-05-11 MED ORDER — LIDOCAINE 2% (20 MG/ML) 5 ML SYRINGE
INTRAMUSCULAR | Status: AC
  Filled 2019-05-11: qty 5

## 2019-05-11 MED ORDER — LIDOCAINE 2% (20 MG/ML) 5 ML SYRINGE
INTRAMUSCULAR | Status: DC | PRN
  Administered 2019-05-11: 100 mg via INTRAVENOUS

## 2019-05-11 SURGICAL SUPPLY — 31 items
BAG DRAIN URO-CYSTO SKYTR STRL (DRAIN) ×3 IMPLANT
BAG DRN UROCATH (DRAIN) ×2
BASKET LASER NITINOL 1.9FR (BASKET) ×1 IMPLANT
BASKET ZERO TIP NITINOL 2.4FR (BASKET) ×1 IMPLANT
BSKT STON RTRVL 120 1.9FR (BASKET) ×2
BSKT STON RTRVL ZERO TP 2.4FR (BASKET) ×2
CATH INTERMIT  6FR 70CM (CATHETERS) ×1 IMPLANT
CATH URET 5FR 28IN CONE TIP (BALLOONS)
CATH URET 5FR 70CM CONE TIP (BALLOONS) IMPLANT
CATH URET DUAL LUMEN 6-10FR 50 (CATHETERS) ×1 IMPLANT
CLOTH BEACON ORANGE TIMEOUT ST (SAFETY) ×3 IMPLANT
EXTRACTOR STONE 1.7FRX115CM (UROLOGICAL SUPPLIES) IMPLANT
FIBER LASER FLEXIVA 365 (UROLOGICAL SUPPLIES) IMPLANT
FIBER LASER TRAC TIP (UROLOGICAL SUPPLIES) IMPLANT
GLOVE BIO SURGEON STRL SZ8 (GLOVE) ×3 IMPLANT
GLOVE BIOGEL PI IND STRL 8.5 (GLOVE) IMPLANT
GLOVE BIOGEL PI INDICATOR 8.5 (GLOVE) ×2
GLOVE INDICATOR 8.5 STRL (GLOVE) ×1 IMPLANT
GOWN STRL REUS W/TWL XL LVL3 (GOWN DISPOSABLE) ×4 IMPLANT
GUIDEWIRE ANG ZIPWIRE 038X150 (WIRE) IMPLANT
GUIDEWIRE STR DUAL SENSOR (WIRE) ×4 IMPLANT
IV NS IRRIG 3000ML ARTHROMATIC (IV SOLUTION) ×6 IMPLANT
KIT TURNOVER CYSTO (KITS) ×3 IMPLANT
MANIFOLD NEPTUNE II (INSTRUMENTS) ×1 IMPLANT
NS IRRIG 500ML POUR BTL (IV SOLUTION) ×3 IMPLANT
PACK CYSTO (CUSTOM PROCEDURE TRAY) ×3 IMPLANT
SHEATH URET ACCESS 12FR/35CM (UROLOGICAL SUPPLIES) ×1 IMPLANT
STENT POLARIS LOOP 6FR X 24 CM (STENTS) ×1 IMPLANT
TUBE CONNECTING 12X1/4 (SUCTIONS) ×1 IMPLANT
TUBING UROLOGY SET (TUBING) ×1 IMPLANT
WATER STERILE IRR 3000ML UROMA (IV SOLUTION) IMPLANT

## 2019-05-11 NOTE — Transfer of Care (Signed)
Immediate Anesthesia Transfer of Care Note  Patient: Quinci Gavidia Tobey  Procedure(s) Performed: Procedure(s) (LRB): URETEROSCOPY,RETROGRADE WITH HOLMIUM LASER LITHOTRIPSY, STENT PLACEMENT (Left) HOLMIUM LASER APPLICATION  Patient Location: PACU  Anesthesia Type: General  Level of Consciousness: awake, oriented, sedated and patient cooperative  Airway & Oxygen Therapy: Patient Spontanous Breathing and Patient connected to face mask oxygen  Post-op Assessment: Report given to PACU RN and Post -op Vital signs reviewed and stable  Post vital signs: Reviewed and stable  Complications: No apparent anesthesia complications  Last Vitals:  Vitals Value Taken Time  BP 128/75 05/11/19 1036  Temp    Pulse 79 05/11/19 1037  Resp 12 05/11/19 1037  SpO2 98 % 05/11/19 1037  Vitals shown include unvalidated device data.  Last Pain:  Vitals:   05/11/19 0738  TempSrc: Oral

## 2019-05-11 NOTE — H&P (Signed)
HPI: [name] [name] is a 48 year-old female who presents today for treatment of her left renal calculus.  The problem is on the left side. Her symptoms include blood in urine. Patient denies having flank pain, back pain, groin pain, nausea, vomiting, fever, and chills. She first began experiencing symptoms 08/19/2018. This is not her first kidney stone. Her first stone was 09/18/2006. She has had 1 stones prior to getting this one. She is not currently having flank pain, back pain, groin pain, nausea, vomiting, fever or chills. She has not caught a stone in her urine strainer since her symptoms began.   She has never had surgical treatment for calculi in the past.   03/20/19: She recently underwent a lumbar spine series on 12/27/18 which reveals a 2 cm stone located within the area of the renal pelvis on the left-hand side. A CT scan done in 12/12 revealed no evidence of calculi at that time.  She reports that she began having pain in 9/19 located in the right lower quadrant that she described as severe but only lasted for about 30 minutes. She was evaluated and was found to have blood in her urine but it did appear infected however she was treated with antibiotics and a culture was performed and found to be negative. She continued to have intermittent pain and again was found to have microscopic blood in her urine and again treated with antibiotics with a culture again being noted to be negative. She has had intermittent RIGHT flank pain that she said usually occurs at night. This has been associated with gross hematuria. She has not see any stones pass.   04/03/19: She has returned for imaging of her large left renal calculus. No new complaints are noted today.     ALLERGIES: sulfa - fever    MEDICATIONS: Aspirin  Zyrtec 10 mg capsule  Birth Control  Propranolol Hcl 60 mg tablet     GU PSH: None   NON-GU PSH: Foot surgery (unspecified), Left Wrist Arthroscopy/surgery, Left     GU PMH: Renal  calculus, Left, She is going to be scheduled for a CT scan to evaluate her left renal calculus and her right-sided flank pain. - 03/20/2019      PMH Notes: Microscopic hematuria: She was evaluated for this by Dr. Janice Norrie in 6/06 with a renal ultrasound which revealed left renal cyst but no other abnormality and negative cystoscopy at as well as an MP 22.   Left renal cyst: These were noted on ultrasound in 6/06.   Calculus disease: She passed a stone in 8/0 6.  Stone analysis: 90% calcium oxalate and 10% calcium phosphate.   NON-GU PMH: None   FAMILY HISTORY: None   SOCIAL HISTORY: Marital Status: Single Preferred Language: English; Ethnicity: Not Hispanic Or Latino; Race: White Current Smoking Status: Patient has never smoked.   Tobacco Use Assessment Completed: Used Tobacco in last 30 days? Does drink.  Does not drink caffeine.    REVIEW OF SYSTEMS:    GU Review Female:   Patient denies frequent urination, hard to postpone urination, burning /pain with urination, get up at night to urinate, leakage of urine, stream starts and stops, trouble starting your stream, have to strain to urinate, and being pregnant.  Gastrointestinal (Upper):   Patient denies nausea, vomiting, and indigestion/ heartburn.  Gastrointestinal (Lower):   Patient denies diarrhea and constipation.  Constitutional:   Patient denies fever, night sweats, weight loss, and fatigue.  Skin:   Patient denies skin  rash/ lesion and itching.  Eyes:   Patient denies blurred vision and double vision.  Ears/ Nose/ Throat:   Patient denies sore throat and sinus problems.  Hematologic/Lymphatic:   Patient denies swollen glands and easy bruising.  Cardiovascular:   Patient denies leg swelling and chest pains.  Respiratory:   Patient denies cough and shortness of breath.  Endocrine:   Patient denies excessive thirst.  Musculoskeletal:   Patient denies back pain and joint pain.  Neurological:   Patient denies headaches and dizziness.   Psychologic:   Patient denies depression and anxiety.   VITAL SIGNS:    Weight 225 lb / 102.06 kg  Height 71 in / 180.34 cm  BP 107/68 mmHg  Pulse 70 /min  BMI 31.4 kg/m   MULTI-SYSTEM PHYSICAL EXAMINATION:    Constitutional: Well-nourished. No physical deformities. Normally developed. Good grooming.  Neck: Neck symmetrical, not swollen. Normal tracheal position.  Respiratory: No labored breathing, no use of accessory muscles.   Cardiovascular: Normal temperature, normal extremity pulses, no swelling, no varicosities.  Skin: No paleness, no jaundice, no cyanosis. No lesion, no ulcer, no rash.  Neurologic / Psychiatric: Oriented to time, oriented to place, oriented to person. No depression, no anxiety, no agitation.  Gastrointestinal: No mass, no tenderness, no rigidity, non obese abdomen.  Eyes: Normal conjunctivae. Normal eyelids.  Ears, Nose, Mouth, and Throat: Left ear no scars, no lesions, no masses. Right ear no scars, no lesions, no masses. Nose no scars, no lesions, no masses. Normal hearing. Normal lips.  Musculoskeletal: Normal gait and station of head and neck.     ASSESSMENT/PLAN:  We discussed the management of urinary stones. These options include observation, ureteroscopy, shockwave lithotripsy, and PCNL. We discussed which options are relevant to these particular stones. We discussed the natural history of stones as well as the complications of untreated stones and the impact on quality of life without treatment as well as with each of the above listed treatments. We also discussed the efficacy of each treatment in its ability to clear the stone burden. With any of these management options I discussed the signs and symptoms of infection and the need for emergent treatment should these be experienced. For each option we discussed the ability of each procedure to clear the patient of their stone burden.   For observation I described the risks which include but are not limited  to silent renal damage, life-threatening infection, need for emergent surgery, failure to pass stone, and pain.   For ureteroscopy I described the risks which include heart attack, stroke, pulmonary embolus, death, bleeding, infection, damage to contiguous structures, positioning injury, ureteral stricture, ureteral avulsion, ureteral injury, need for ureteral stent, inability to perform ureteroscopy, need for an interval procedure, inability to clear stone burden, stent discomfort and pain.   For shockwave lithotripsy I described the risks which include arrhythmia, kidney contusion, kidney hemorrhage, need for transfusion, long-term risk of diabetes or hypertension, back discomfort, flank ecchymosis, flank abrasion, inability to break up stone, inability to pass stone fragments, Steinstrasse, infection associated with obstructing stones, need for different surgical procedure, need for repeat shockwave lithotripsy, and death.   For PCNL I described the risks including heart attack, sure, pulmonary embolus, death, positioning injury, pneumothorax, hydrothorax, need for chest tube, inability to clear stone burden, renal laceration, arterial venous fistula or malformation, need for embolization of kidney, loss of kidney or renal function, need for repeat procedure, need for prolonged nephrostomy tube, ureteral avulsion, fistula.  1 GU:  Renal calculus - N20.0 Left, She has 2 left renal calculi that are nonobstructing.  2   Ureteral calculus - N20.1 Left, Her stone by CT scan measured 1.5 x 1.5 cm. It has Hounsfield units of 1300 and after discussing each of the options for management she has elected to proceed with ureteroscopy.

## 2019-05-11 NOTE — Anesthesia Postprocedure Evaluation (Signed)
Anesthesia Post Note  Patient: [name] [name]  Procedure(s) Performed: URETEROSCOPY,RETROGRADE WITH HOLMIUM LASER LITHOTRIPSY, STENT PLACEMENT (Left Renal) HOLMIUM LASER APPLICATION (Renal)     Patient location during evaluation: PACU Anesthesia Type: General Level of consciousness: awake and alert Pain management: pain level controlled Vital Signs Assessment: post-procedure vital signs reviewed and stable Respiratory status: spontaneous breathing, nonlabored ventilation, respiratory function stable and patient connected to nasal cannula oxygen Cardiovascular status: blood pressure returned to baseline and stable Postop Assessment: no apparent nausea or vomiting Anesthetic complications: no    Last Vitals:  Vitals:   05/11/19 1100 05/11/19 1130  BP: 126/73 127/68  Pulse: 72 66  Resp: 12 14  Temp:  36.4 C  SpO2: 97% 100%    Last Pain:  Vitals:   05/11/19 1130  TempSrc:   PainSc: 0-No pain                 Nakiah Osgood S

## 2019-05-11 NOTE — Discharge Instructions (Signed)
Post stone removal/stent placement surgery instructions   Definitions:  Ureter: The duct that transports urine from the kidney to the bladder. Stent: A plastic hollow tube that is placed into the ureter, from the kidney to the bladder to prevent the ureter from swelling shut.  General instructions:  Despite the fact that no skin incisions were used, the area around the ureter and bladder is raw and irritated. The stent is a foreign body which will further irritate the bladder wall. This irritation is manifested by increased frequency of urination, both day and night, and by an increase in the urge to urinate. In some, the urge to urinate is present almost always. Sometimes the urge is strong enough that you may not be able to stop your self from urinating. The only real cure is to remove the stent and then give time for the bladder wall to heal which can't be done until the danger of the ureter swelling shut has passed. (This varies from 2-21 days).  You may see some blood in your urine while the stent is in place and a few days afterward. Do not be alarmed, even if the urine is clear for a while. Get off your feet and drink lots of fluids until clearing occurs. If you start to pass clots or don't improve, call us.  If you have a string coming from your urethra:  The stent string is attached to your ureteral stent.  Do not pull on thisIf you have a string coming from your urethra:  The stent string is attached to your ureteral stent.  Do not pull on this.  Diet:  You may return to your normal diet immediately. Because of the raw surface of your bladder, alcohol, spicy foods, foods high in acid and drinks with caffeine may cause irritation or frequency and should be used in moderation. To keep your urine flowing freely and avoid constipation, drink plenty of fluids during the day (8-10 glasses). Tip: Avoid cranberry juice because it is very acidic.  Activity:  Your physical activity doesn't need  to be restricted. However, if you are very active, you may see some blood in the urine. We suggest that you reduce your activity under the circumstances until the bleeding has stopped.  Bowels:  It is important to keep your bowels regular during the postoperative period. Straining with bowel movements can cause bleeding. A bowel movement every other day is reasonable. Use a mild laxative if needed, such as milk of magnesia 2-3 tablespoons, or 2 Dulcolax tablets. Call if you continue to have problems. If you had been taking narcotics for pain, before, during or after your surgery, you may be constipated. Take a laxative if necessary.     Medication:  You should resume your pre-surgery medications unless told not to. DO NOT RESUME YOUR ASPIRIN, or any other medicines like ibuprofen, motrin, excedrin, advil, aleve, vitamin E, fish oil as these can all cause bleeding x 7 days. In addition you may be given an antibiotic to prevent or treat infection. Antibiotics are not always necessary. All medication should be taken as prescribed until the bottles are finished unless you are having an unusual reaction to one of the drugs.  Problems you should report to [place]:  a. Fever greater than 101F. b. Heavy bleeding, or clots (see notes above about blood in urine). c. Inability to urinate. d. Drug reactions (hives, rash, nausea, vomiting, diarrhea). e. Severe burning or pain with urination that is not improving.  Followup:  You will need a followup appointment to monitor your progress in most cases. Please call the office for this appointment when you get home if your appointment has not already been scheduled. Usually the first appointment will be about 5-14 days after your surgery and if you have a stent in place it will likely be removed at that time.   Post Anesthesia Home Care Instructions  Activity: Get plenty of rest for the remainder of the day. A responsible individual must stay with you for 24  hours following the procedure.  For the next 24 hours, DO NOT: -Drive a car -Paediatric nurse -Drink alcoholic beverages -Take any medication unless instructed by your physician -Make any legal decisions or sign important papers.  Meals: Start with liquid foods such as gelatin or soup. Progress to regular foods as tolerated. Avoid greasy, spicy, heavy foods. If nausea and/or vomiting occur, drink only clear liquids until the nausea and/or vomiting subsides. Call your physician if vomiting continues.  Special Instructions/Symptoms: Your throat may feel dry or sore from the anesthesia or the breathing tube placed in your throat during surgery. If this causes discomfort, gargle with warm salt water. The discomfort should disappear within 24 hours.

## 2019-05-11 NOTE — Anesthesia Procedure Notes (Signed)
Procedure Name: LMA Insertion Date/Time: 05/11/2019 8:54 AM Performed by: Suan Halter, CRNA Pre-anesthesia Checklist: Patient identified, Emergency Drugs available, Suction available and Patient being monitored Patient Re-evaluated:Patient Re-evaluated prior to induction Oxygen Delivery Method: Circle system utilized Preoxygenation: Pre-oxygenation with 100% oxygen Induction Type: IV induction Ventilation: Mask ventilation without difficulty LMA: LMA inserted LMA Size: 4.0 Number of attempts: 1 Airway Equipment and Method: Bite block Placement Confirmation: positive ETCO2 Tube secured with: Tape Dental Injury: Teeth and Oropharynx as per pre-operative assessment

## 2019-05-11 NOTE — Anesthesia Preprocedure Evaluation (Signed)
Anesthesia Evaluation  Patient identified by MRN, date of birth, ID band Patient awake    Reviewed: Allergy & Precautions, H&P , NPO status , Patient's Chart, lab work & pertinent test results  Airway Mallampati: II   Neck ROM: full    Dental   Pulmonary    breath sounds clear to auscultation       Cardiovascular hypertension, + dysrhythmias Supra Ventricular Tachycardia  Rhythm:regular Rate:Normal  SVT resolved s/p EP ablation   Neuro/Psych  Headaches,    GI/Hepatic   Endo/Other  obese  Renal/GU stones     Musculoskeletal   Abdominal   Peds  Hematology   Anesthesia Other Findings   Reproductive/Obstetrics                             Anesthesia Physical Anesthesia Plan  ASA: II  Anesthesia Plan: General   Post-op Pain Management:    Induction: Intravenous  PONV Risk Score and Plan: 3 and Ondansetron, Dexamethasone, Midazolam, Scopolamine patch - Pre-op and Treatment may vary due to age or medical condition  Airway Management Planned: LMA  Additional Equipment:   Intra-op Plan:   Post-operative Plan:   Informed Consent: I have reviewed the patients History and Physical, chart, labs and discussed the procedure including the risks, benefits and alternatives for the proposed anesthesia with the patient or authorized representative who has indicated his/her understanding and acceptance.       Plan Discussed with: CRNA, Anesthesiologist and Surgeon  Anesthesia Plan Comments:         Anesthesia Quick Evaluation

## 2019-05-11 NOTE — Op Note (Signed)
PATIENT:  [name] [name]  PRE-OPERATIVE DIAGNOSIS:  left Ureteral calculus  POST-OPERATIVE DIAGNOSIS: Same  PROCEDURE:  1.  Cystoscopy with left retrograde pyelogram including interpretation. 2.  Left ureteroscopy, laser lithotripsy and stent placement. 3.  Left renal stone extraction. 4.  Fluoroscopy time greater than 1 hour and less than 2 hours.  SURGEON: Claybon Jabs, MD  INDICATION: [name] [name] is a 48 year old female who underwent a lumbar spine series which revealed a 2 cm stone located in the area of the renal pelvis on the left-hand side.  The stone was not present on CT scan in 12/12.  She had begun experiencing pain but it was in the right lower quadrant.  Evaluation revealed blood in her urine.  Cultures were negative.  She continued to have intermittent right (not left) flank pain that usually occurred at night.  A KUB confirmed the large stone in her left renal pelvis and we discussed the options for management.  She is elected to proceed with ureteroscopy.  ANESTHESIA:  General  EBL:  Minimal  DRAINS: 6 French, 24 cm Polaris stent in the left ureter (string placed in vagina)  SPECIMEN: Stone taken for stone analysis.  DESCRIPTION OF PROCEDURE: The patient was taken to the major OR and placed on the table. General anesthesia was administered and then the patient was moved to the dorsal lithotomy position. The genitalia was sterilely prepped and draped. An official timeout was performed.  Initially the 61 French cystoscope with 30 lens was passed under direct vision into the bladder. The bladder was then fully inspected. It was noted be free of any tumors, stones or inflammatory lesions. Ureteral orifices were of normal configuration and position. A 6 French open-ended ureteral catheter was then passed through the cystoscope into the ureteral orifice in order to perform a left retrograde pyelogram.  A retrograde pyelogram was performed by injecting full-strength contrast  up the left ureter under direct fluoroscopic control. It revealed a filling defect in the renal pelvis consistent with the stone seen on the preoperative KUB. The remainder of the ureter was noted to be normal as was the intrarenal collecting system. I then passed a 0.038 inch floppy-tipped guidewire through the open ended catheter and into the area of the renal pelvis and this was left in place.  Over this guidewire a dual lumen ureteral catheter was gently advanced into the area of the renal pelvis and a second guidewire was then passed through the dual-lumen catheter and with both guidewires in the area of the renal pelvis the dual-lumen catheter was removed.  I then used the inner portion of the ureteral access sheath and passed this over the guidewire gently dilating the ureter.  It was removed and then the ureteral access sheath was assembled and passed over the guidewire gently up to the area of the renal pelvis and with 1 guidewire remaining in place as a safety guidewire the inner portion of the ureteral access sheath and second guidewire were removed.  I then proceeded with ureteroscopy.  A 6 [place] digital, flexible ureteroscope was then passed through the access sheath and into the area of the renal pelvis.  The stone was identified and I felt it was too large to extract and therefore elected to proceed with laser lithotripsy. The 200  holmium laser fiber was used to fragment the stone. I then used the nitinol basket to extract some of the larger stone fragments which were only about 2 to 3 mm in size.  I then used the laser to "popcorn"/dust the remaining stone into pieces approximately 1 mm and less in size.  I then inspected each calyx starting at the upper pole moving to the middle pole and then lower pole and noted no significant stone fragments any of these locations other than the dusted stone fragments in the upper pole.  I therefore removed the ureteroscope and gently remove the access  sheath.  I then backloaded the cystoscope over the guidewire and passed the stent over the guidewire into the area of the renal pelvis. As the guidewire was removed good curl was noted in the renal pelvis. The bladder was drained and the cystoscope was then removed. The patient tolerated the procedure well no intraoperative complications.  PLAN OF CARE: Discharge to home after PACU  PATIENT DISPOSITION:  PACU - hemodynamically stable.

## 2019-05-14 ENCOUNTER — Encounter (HOSPITAL_BASED_OUTPATIENT_CLINIC_OR_DEPARTMENT_OTHER): Payer: Self-pay | Admitting: Urology

## 2019-05-22 ENCOUNTER — Other Ambulatory Visit: Payer: Self-pay | Admitting: Physician Assistant

## 2019-05-22 DIAGNOSIS — M545 Low back pain, unspecified: Secondary | ICD-10-CM

## 2019-06-09 ENCOUNTER — Other Ambulatory Visit: Payer: Self-pay

## 2019-06-09 ENCOUNTER — Ambulatory Visit
Admission: RE | Admit: 2019-06-09 | Discharge: 2019-06-09 | Disposition: A | Discharge status: Home / Self Care | Payer: BC Managed Care – PPO | Source: Ambulatory Visit | Attending: Physician Assistant | Admitting: Physician Assistant

## 2019-06-09 DIAGNOSIS — M545 Low back pain, unspecified: Secondary | ICD-10-CM

## 2019-07-11 ENCOUNTER — Other Ambulatory Visit: Payer: Self-pay | Admitting: Physician Assistant

## 2019-07-11 DIAGNOSIS — R932 Abnormal findings on diagnostic imaging of liver and biliary tract: Secondary | ICD-10-CM

## 2019-07-30 ENCOUNTER — Ambulatory Visit
Admission: RE | Admit: 2019-07-30 | Discharge: 2019-07-30 | Disposition: A | Discharge status: Home / Self Care | Payer: BC Managed Care – PPO | Source: Ambulatory Visit | Attending: Physician Assistant | Admitting: Physician Assistant

## 2019-07-30 ENCOUNTER — Other Ambulatory Visit: Payer: Self-pay

## 2019-07-30 DIAGNOSIS — R932 Abnormal findings on diagnostic imaging of liver and biliary tract: Secondary | ICD-10-CM

## 2019-07-30 MED ORDER — GADOBENATE DIMEGLUMINE 529 MG/ML IV SOLN
20.0000 mL | Freq: Once | INTRAVENOUS | Status: AC | PRN
  Administered 2019-07-30: 16:00:00 20 mL via INTRAVENOUS

## 2019-08-18 ENCOUNTER — Other Ambulatory Visit: Payer: Self-pay

## 2019-08-18 ENCOUNTER — Inpatient Hospital Stay (HOSPITAL_COMMUNITY)
Admission: EM | Admit: 2019-08-18 | Discharge: 2019-08-20 | DRG: 419 | Disposition: A | Discharge status: Home / Self Care | Payer: BC Managed Care – PPO | Attending: General Surgery | Admitting: General Surgery

## 2019-08-18 ENCOUNTER — Emergency Department (HOSPITAL_COMMUNITY): Payer: BC Managed Care – PPO

## 2019-08-18 DIAGNOSIS — Z20828 Contact with and (suspected) exposure to other viral communicable diseases: Secondary | ICD-10-CM | POA: Diagnosis present

## 2019-08-18 DIAGNOSIS — Z882 Allergy status to sulfonamides status: Secondary | ICD-10-CM

## 2019-08-18 DIAGNOSIS — Z23 Encounter for immunization: Secondary | ICD-10-CM

## 2019-08-18 DIAGNOSIS — K8010 Calculus of gallbladder with chronic cholecystitis without obstruction: Principal | ICD-10-CM | POA: Diagnosis present

## 2019-08-18 DIAGNOSIS — K8000 Calculus of gallbladder with acute cholecystitis without obstruction: Secondary | ICD-10-CM

## 2019-08-18 DIAGNOSIS — R809 Proteinuria, unspecified: Secondary | ICD-10-CM

## 2019-08-18 DIAGNOSIS — R1011 Right upper quadrant pain: Secondary | ICD-10-CM

## 2019-08-18 DIAGNOSIS — R3129 Other microscopic hematuria: Secondary | ICD-10-CM

## 2019-08-18 DIAGNOSIS — Z820 Family history of epilepsy and other diseases of the nervous system: Secondary | ICD-10-CM

## 2019-08-18 DIAGNOSIS — Z7982 Long term (current) use of aspirin: Secondary | ICD-10-CM

## 2019-08-18 DIAGNOSIS — G43909 Migraine, unspecified, not intractable, without status migrainosus: Secondary | ICD-10-CM | POA: Diagnosis present

## 2019-08-18 DIAGNOSIS — I10 Essential (primary) hypertension: Secondary | ICD-10-CM | POA: Diagnosis present

## 2019-08-18 DIAGNOSIS — Z91048 Other nonmedicinal substance allergy status: Secondary | ICD-10-CM

## 2019-08-18 DIAGNOSIS — Z833 Family history of diabetes mellitus: Secondary | ICD-10-CM

## 2019-08-18 HISTORY — DX: Calculus of gallbladder with acute cholecystitis without obstruction: K80.00

## 2019-08-18 LAB — COMPREHENSIVE METABOLIC PANEL
ALT: 95 U/L — ABNORMAL HIGH (ref 0–44)
AST: 92 U/L — ABNORMAL HIGH (ref 15–41)
Albumin: 4 g/dL (ref 3.5–5.0)
Alkaline Phosphatase: 40 U/L (ref 38–126)
Anion gap: 11 (ref 5–15)
BUN: 18 mg/dL (ref 6–20)
CO2: 23 mmol/L (ref 22–32)
Calcium: 9 mg/dL (ref 8.9–10.3)
Chloride: 105 mmol/L (ref 98–111)
Creatinine, Ser: 0.83 mg/dL (ref 0.44–1.00)
GFR calc Af Amer: >60 mL/min (ref >60)
GFR calc non Af Amer: >60 mL/min (ref >60)
Glucose, Bld: 124 mg/dL — ABNORMAL HIGH (ref 70–99)
Potassium: 5 mmol/L (ref 3.5–5.1)
Sodium: 139 mmol/L (ref 135–145)
Total Bilirubin: 0.7 mg/dL (ref 0.3–1.2)
Total Protein: 7.2 g/dL (ref 6.5–8.1)

## 2019-08-18 LAB — CBC
HCT: 48.5 % — ABNORMAL HIGH (ref 36.0–46.0)
Hemoglobin: 15.5 g/dL — ABNORMAL HIGH (ref 12.0–15.0)
MCH: 30.8 pg (ref 26.0–34.0)
MCHC: 32 g/dL (ref 30.0–36.0)
MCV: 96.4 fL (ref 80.0–100.0)
Platelets: 226 10*3/uL (ref 150–400)
RBC: 5.03 MIL/uL (ref 3.87–5.11)
RDW: 13.3 % (ref 11.5–15.5)
WBC: 17.7 10*3/uL — ABNORMAL HIGH (ref 4.0–10.5)
nRBC: 0 % (ref 0.0–0.2)

## 2019-08-18 LAB — URINALYSIS, ROUTINE W REFLEX MICROSCOPIC
Bilirubin Urine: NEGATIVE
Glucose, UA: NEGATIVE mg/dL
Ketones, ur: NEGATIVE mg/dL
Leukocytes,Ua: NEGATIVE
Nitrite: NEGATIVE
Protein, ur: 100 mg/dL — AB
Specific Gravity, Urine: 1.028 (ref 1.005–1.030)
pH: 5 (ref 5.0–8.0)

## 2019-08-18 LAB — I-STAT BETA HCG BLOOD, ED (MC, WL, AP ONLY): I-stat hCG, quantitative: <5 m[IU]/mL (ref <5)

## 2019-08-18 LAB — LIPASE, BLOOD: Lipase: 38 U/L (ref 11–51)

## 2019-08-18 LAB — HIV ANTIBODY (ROUTINE TESTING W REFLEX): HIV Screen 4th Generation wRfx: NONREACTIVE

## 2019-08-18 MED ORDER — INFLUENZA VAC SPLIT QUAD 0.5 ML IM SUSY
0.5000 mL | PREFILLED_SYRINGE | INTRAMUSCULAR | Status: AC
  Administered 2019-08-20: 0.5 mL via INTRAMUSCULAR
  Filled 2019-08-18: qty 0.5

## 2019-08-18 MED ORDER — ACETAMINOPHEN 650 MG RE SUPP: 650.0000 mg | Freq: Four times a day (QID) | RECTAL | Status: DC | PRN

## 2019-08-18 MED ORDER — KCL IN DEXTROSE-NACL 20-5-0.45 MEQ/L-%-% IV SOLN
INTRAVENOUS | Status: DC
  Administered 2019-08-18: 21:00:00 via INTRAVENOUS
  Filled 2019-08-18: qty 1000

## 2019-08-18 MED ORDER — HYDROMORPHONE HCL 1 MG/ML IJ SOLN: 1.0000 mg | INTRAMUSCULAR | Status: DC | PRN

## 2019-08-18 MED ORDER — SODIUM CHLORIDE 0.9 % IV SOLN
2.0000 g | INTRAVENOUS | Status: DC
  Administered 2019-08-19: 2 g via INTRAVENOUS
  Filled 2019-08-18: qty 2

## 2019-08-18 MED ORDER — SODIUM CHLORIDE 0.9% FLUSH
3.0000 mL | Freq: Once | INTRAVENOUS | Status: AC
  Administered 2019-08-18: 06:00:00 3 mL via INTRAVENOUS

## 2019-08-18 MED ORDER — ACETAMINOPHEN 325 MG PO TABS: 650.0000 mg | ORAL_TABLET | Freq: Four times a day (QID) | ORAL | Status: DC | PRN

## 2019-08-18 MED ORDER — HYDROCODONE-ACETAMINOPHEN 5-325 MG PO TABS
1.0000 | ORAL_TABLET | ORAL | Status: DC | PRN
  Administered 2019-08-18: 2 via ORAL
  Filled 2019-08-18: qty 2

## 2019-08-18 MED ORDER — KCL IN DEXTROSE-NACL 20-5-0.45 MEQ/L-%-% IV SOLN: INTRAVENOUS | Status: DC

## 2019-08-18 MED ORDER — ONDANSETRON HCL 4 MG/2ML IJ SOLN: 4.0000 mg | Freq: Four times a day (QID) | INTRAMUSCULAR | Status: DC | PRN

## 2019-08-18 MED ORDER — ONDANSETRON 4 MG PO TBDP: 4.0000 mg | ORAL_TABLET | Freq: Four times a day (QID) | ORAL | Status: DC | PRN

## 2019-08-18 MED ORDER — MUPIROCIN 2 % EX OINT: 1.0000 "application " | TOPICAL_OINTMENT | Freq: Two times a day (BID) | CUTANEOUS | Status: DC

## 2019-08-18 MED ORDER — SODIUM CHLORIDE 0.9 % IV SOLN
2.0000 g | Freq: Once | INTRAVENOUS | Status: AC
  Administered 2019-08-18: 2 g via INTRAVENOUS
  Filled 2019-08-18: qty 20

## 2019-08-18 MED ORDER — SODIUM CHLORIDE 0.9 % IV SOLN
2.0000 g | INTRAVENOUS | Status: DC
  Filled 2019-08-18: qty 20

## 2019-08-18 MED ORDER — MORPHINE SULFATE (PF) 4 MG/ML IV SOLN
4.0000 mg | INTRAVENOUS | Status: AC
  Administered 2019-08-18 (×2): 4 mg via INTRAVENOUS
  Filled 2019-08-18 (×2): qty 1

## 2019-08-18 MED ORDER — ONDANSETRON HCL 4 MG/2ML IJ SOLN
4.0000 mg | Freq: Once | INTRAMUSCULAR | Status: AC | PRN
  Administered 2019-08-18: 4 mg via INTRAVENOUS
  Filled 2019-08-18: qty 2

## 2019-08-18 MED ORDER — SODIUM CHLORIDE 0.9 % IV BOLUS
1000.0000 mL | Freq: Once | INTRAVENOUS | Status: AC
  Administered 2019-08-18: 1000 mL via INTRAVENOUS

## 2019-08-18 MED ORDER — HYDROCODONE-ACETAMINOPHEN 5-325 MG PO TABS: 1.0000 | ORAL_TABLET | ORAL | Status: DC | PRN

## 2019-08-18 NOTE — H&P (Signed)
[name] [name] is an 48 y.o. female.    General Surgery [facility]  Chief Complaint: abdominal pain, cholecystitis, cholelithiasis  HPI: Patient is a 48 year old female who presents to the emergency department with epigastric abdominal pain, nausea, and emesis.  Patient has a known history of gallstones.  She developed symptoms on the night prior to admission.  Pain persisted and she presented for evaluation.  Afebrile.  WBC elevated at 17.7.  Ultrasound shows cholelithiasis.  There is no wall thickening or evidence of pericholecystic fluid.  Liver enzymes show mild elevation of transaminases.  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  These are being followed with sequential MRI scanning.  Patient denies any history of hepatitis or pancreatitis.  She denies jaundice or acholic stools.  She denies fevers or chills.  Previous abdominal surgery includes laparoscopy for endometriosis.  There is a family history of gallbladder disease in multiple family members including her mother.  Patient now presents for surgical evaluation and management for symptomatic cholelithiasis and cholecystitis.  Past Medical History:  Diagnosis Date  . Endometriosis   . History of kidney stones   . History of supraventricular tachycardia 05-09-2019  pt denies any cardiac S&S including no palpitations   03-11-2017   s/p  ablation by dr g. taylor;  primary cardiologist dr Claiborne Billings,  pt released on as needed basis  . Hypertension   . Migraines   . Seasonal allergies   . Ureteropelvic junction calculus    left side    Past Surgical History:  Procedure Laterality Date  . EXCISION OF BREAST BIOPSY Right 07-21-2000   dr Lucia Gaskins @[facility]   . HOLMIUM LASER APPLICATION  02/02/9562   Procedure: HOLMIUM LASER APPLICATION;  Surgeon: Kathie Rhodes, MD;  Location[facility];  Service: Urology;;  . PELVIC LAPAROSCOPY  1990s   endometriosis  .  PLANTAR FASCIA SURGERY Left 2018  . SVT ABLATION N/A 03/11/2017   Procedure: SVT Ablation;  Surgeon: Evans Lance, MD;  Location: [facility];  Service: Cardiovascular;  Laterality: N/A;  . URETEROSCOPY WITH HOLMIUM LASER LITHOTRIPSY Left 05/11/2019   Procedure: URETEROSCOPY,RETROGRADE WITH HOLMIUM LASER LITHOTRIPSY, STENT PLACEMENT;  Surgeon: Kathie Rhodes, MD;  Location: [facility];  Service: Urology;  Laterality: Left;  . WRIST SURGERY Left 2012   cyst removal    Family History  Problem Relation Age of Onset  . Diabetes Mother        did of complications of C-Diff  . Parkinson's disease Father    Social History:  reports that she has never smoked. She has never used smokeless tobacco. She reports current alcohol use. She reports that she does not use drugs.  Allergies:  Allergies  Allergen Reactions  . Sulfa Antibiotics Other (See Comments)    Other reaction(s): Fever  . Adhesive [Tape] Itching and Swelling    Redness ,swelling and itching    (Not in a hospital admission)   Results for orders placed or performed during the hospital encounter of 08/18/19 (from the past 48 hour(s))  Lipase, blood     Status: None   Collection Time: 08/18/19  6:05 AM  Result Value Ref Range   Lipase 38 11 - 51 U/L    Comment: Performed at [facility], [place] [address], [place], [place] [address]  Comprehensive metabolic panel     Status: Abnormal   Collection Time: 08/18/19  6:05 AM  Result  Value Ref Range   Sodium 139 135 - 145 mmol/L   Potassium 5.0 3.5 - 5.1 mmol/L   Chloride 105 98 - 111 mmol/L   CO2 23 22 - 32 mmol/L   Glucose, Bld 124 (H) 70 - 99 mg/dL   BUN 18 6 - 20 mg/dL   Creatinine, Ser 0.83 0.44 - 1.00 mg/dL   Calcium 9.0 8.9 - 10.3 mg/dL   Total Protein 7.2 6.5 - 8.1 g/dL   Albumin 4.0 3.5 - 5.0 g/dL   AST 92 (H) 15 - 41 U/L   ALT 95 (H) 0 - 44 U/L   Alkaline Phosphatase 40 38 - 126 U/L   Total Bilirubin 0.7 0.3 - 1.2 mg/dL    GFR calc non Af Amer >60 >60 mL/min   GFR calc Af Amer >60 >60 mL/min   Anion gap 11 5 - 15    Comment: Performed at [facility], [place] [address] [place], [place] [address]  CBC     Status: Abnormal   Collection Time: 08/18/19  6:05 AM  Result Value Ref Range   WBC 17.7 (H) 4.0 - 10.5 K/uL   RBC 5.03 3.87 - 5.11 MIL/uL   Hemoglobin 15.5 (H) 12.0 - 15.0 g/dL   HCT 48.5 (H) 36.0 - 46.0 %   MCV 96.4 80.0 - 100.0 fL   MCH 30.8 26.0 - 34.0 pg   MCHC 32.0 30.0 - 36.0 g/dL   RDW 13.3 11.5 - 15.5 %   Platelets 226 150 - 400 K/uL   nRBC 0.0 0.0 - 0.2 %    Comment: Performed at [facility], [place] [address], [place], [place] [address]  Urinalysis, Routine w reflex microscopic     Status: Abnormal   Collection Time: 08/18/19  6:05 AM  Result Value Ref Range   Color, Urine YELLOW YELLOW   APPearance HAZY (A) CLEAR   Specific Gravity, Urine 1.028 1.005 - 1.030   pH 5.0 5.0 - 8.0   Glucose, UA NEGATIVE NEGATIVE mg/dL   Hgb urine dipstick MODERATE (A) NEGATIVE   Bilirubin Urine NEGATIVE NEGATIVE   Ketones, ur NEGATIVE NEGATIVE mg/dL   Protein, ur 100 (A) NEGATIVE mg/dL   Nitrite NEGATIVE NEGATIVE   Leukocytes,Ua NEGATIVE NEGATIVE   RBC / HPF 0-5 0 - 5 RBC/hpf   WBC, UA 11-20 0 - 5 WBC/hpf   Bacteria, UA RARE (A) NONE SEEN   Squamous Epithelial / LPF 0-5 0 - 5   Mucus PRESENT     Comment: Performed at [facility], [place] [address] [place], [place] [address]  I-Stat beta hCG blood, ED     Status: None   Collection Time: 08/18/19  6:16 AM  Result Value Ref Range   I-stat hCG, quantitative <5.0 <5 mIU/mL   Comment 3            Comment:   GEST. AGE      CONC.  (mIU/mL)   <=1 WEEK        5 - 50     2 WEEKS       50 - 500     3 WEEKS       100 - 10,000     4 WEEKS     1,000 - 30,000        FEMALE AND NON-PREGNANT FEMALE:     LESS THAN 5 mIU/mL    US Abdomen Limited  Result Date: 08/18/2019 CLINICAL DATA:  Right upper quadrant  abdominal pain. EXAM: ULTRASOUND ABDOMEN LIMITED RIGHT UPPER QUADRANT COMPARISON:  MRI abdomen 07/30/2019 FINDINGS: Gallbladder: Small stone within the gallbladder lumen. No gallbladder wall thickening or pericholecystic fluid. Negative sonographic Murphy sign. Common bile duct: Diameter: 2 mm Liver: Multiple masses are demonstrated within the liver. There is a 1.5 x 0.8 x 1.3 cm hypoechoic mass. There is a 6.7 x 5.4 x 7.3 cm mixed echogenicity mass. There is a 1.7 x 1.1 x 0.1 cm hypoechoic mass. Portal vein is patent on color Doppler imaging with normal direction of blood flow towards the liver. Other: None. IMPRESSION: 1. Cholelithiasis without secondary signs to suggest acute cholecystitis. 2. Multiple hepatic masses, previously evaluated on MRI dated 07/30/2019. Recommendation on MRI was for a follow-up MRI at 6 months from that examination (12/2019). Electronically Signed   By: Lovey Newcomer M.D.   On: 08/18/2019 08:35    Review of Systems  Constitutional: Negative for chills, diaphoresis and fever.  HENT: Negative.   Eyes: Negative.   Respiratory: Negative.   Cardiovascular: Positive for palpitations.  Gastrointestinal: Positive for abdominal pain, nausea and vomiting.  Genitourinary: Negative.   Musculoskeletal: Negative.   Skin: Negative.   Neurological: Negative.   Endo/Heme/Allergies: Negative.   Psychiatric/Behavioral: Negative.     Blood pressure 120/84, pulse 73, temperature 97.8 F (36.6 C), temperature source Oral, resp. rate 18, height 5' 11"  (1.803 m), weight 104.3 kg, SpO2 100 %. Physical Exam  Constitutional: She is oriented to person, place, and time. She appears well-developed and well-nourished. No distress.  HENT:  Head: Normocephalic and atraumatic.  Right Ear: External ear normal.  Left Ear: External ear normal.  Eyes: Pupils are equal, round, and reactive to light. Conjunctivae are normal. No scleral icterus.  Neck: Normal range of motion. Neck supple. No tracheal  deviation present. No thyromegaly present.  Cardiovascular: Normal rate, regular rhythm and normal heart sounds.  No murmur heard. Respiratory: Effort normal and breath sounds normal. No respiratory distress. She has no wheezes.  GI: Soft. Bowel sounds are normal. She exhibits no distension and no mass. There is abdominal tenderness (RUQ). There is no rebound and no guarding.  Musculoskeletal: Normal range of motion.        General: No deformity or edema.  Lymphadenopathy:    She has no cervical adenopathy.  Neurological: She is alert and oriented to person, place, and time.  Skin: Skin is warm and dry. She is not diaphoretic.  Psychiatric: She has a normal mood and affect. Her behavior is normal.     Assessment/Plan Cholecystitis, cholelithiasis, abdominal pain - admit to surgical service - Covid testing ongoing - IV abx's for suspected cholecystitis - WBC 17.7 - will repeat labs in AM 11/8 - check liver enzymes, possible GI consultation - tentatively plan lap chole with IOC on 11/8  The risks and benefits of the procedure have been discussed at length with the patient.  The patient understands the proposed procedure, potential alternative treatments, and the course of recovery to be expected.  All of the patient's questions have been answered at this time.  The patient wishes to proceed with surgery.  Armandina Gemma, [facility] Office: [phone]    Armandina Gemma, MD 08/18/2019, 11:03 AM

## 2019-08-18 NOTE — ED Triage Notes (Signed)
Pt presents to ED via [facility]. Pt is CA&Ox4 and ambulatory. EMS reports that pt was trying to drive herself to the ED because at around 2 am she had severe abdominal/flank pain and vomited once. Pt informed EMS that she was seen recently for same and diagnosed with renal stones but was told they would pass on her own. EMS unsuccessful at IV and pain med.

## 2019-08-18 NOTE — ED Provider Notes (Signed)
[facility] DEPT Provider Note   CSN: [id number] Arrival date & time: 08/18/19  0510     History   Chief Complaint Chief Complaint  Patient presents with  . Abdominal Pain  . Flank Pain    HPI [name] [name] is a 48 y.o. female with a hx of endometriosis, kidney stones, SVT, HTN presents to the Emergency Department complaining of gradual, persistent, progressively worsening right upper quadrant abdominal pain that radiates into her right flank onset 2am. Associated symptoms include NBNB emesis X1 prior to arrival.  Patient attempted to drive herself to the emergency department however became violently ill and contacted EMS.  No treatments prior to arrival by patient or EMS.  Patient does report a history of kidney stones on the left for which she had surgical intervention by Dr. Karsten Ro in July 2020.  Patient reports pain today is unlike previous.  Patient does report she recently had an MRI for liver abnormality and was told that she had gallstones but has never had any problems.  No history of cholecystectomy.  She denies fever, chills, headache, neck pain, chest pain, shortness of breath, weakness, dizziness, syncope, dysuria, hematuria, vaginal discharge.    The history is provided by the patient and medical records. No language interpreter was used.    Past Medical History:  Diagnosis Date  . Endometriosis   . History of kidney stones   . History of supraventricular tachycardia 05-09-2019  pt denies any cardiac S&S including no palpitations   03-11-2017   s/p  ablation by dr g. taylor;  primary cardiologist dr Claiborne Billings,  pt released on as needed basis  . Hypertension   . Migraines   . Seasonal allergies   . Ureteropelvic junction calculus    left side    Patient Active Problem List   Diagnosis Date Noted  . Migraine without aura and without status migrainosus, not intractable 02/09/2016  . SVT (supraventricular tachycardia) ([place]) 05/31/2015  .  History of migraine headaches 05/31/2015  . Essential hypertension 05/31/2015    Past Surgical History:  Procedure Laterality Date  . EXCISION OF BREAST BIOPSY Right 07-21-2000   dr Lucia Gaskins @[facility]   . HOLMIUM LASER APPLICATION  8/00/3491   Procedure: HOLMIUM LASER APPLICATION;  Surgeon: Kathie Rhodes, MD;  Location: [facility];  Service: Urology;;  . PELVIC LAPAROSCOPY  1990s   endometriosis  . PLANTAR FASCIA SURGERY Left 2018  . SVT ABLATION N/A 03/11/2017   Procedure: SVT Ablation;  Surgeon: Evans Lance, MD;  Location: [facility];  Service: Cardiovascular;  Laterality: N/A;  . URETEROSCOPY WITH HOLMIUM LASER LITHOTRIPSY Left 05/11/2019   Procedure: URETEROSCOPY,RETROGRADE WITH HOLMIUM LASER LITHOTRIPSY, STENT PLACEMENT;  Surgeon: Kathie Rhodes, MD;  Location: [facility];  Service: Urology;  Laterality: Left;  . WRIST SURGERY Left 2012   cyst removal     OB History   No obstetric history on file.      Home Medications    Prior to Admission medications   Medication Sig Start Date End Date Taking? Authorizing Provider  aspirin 81 MG tablet Take 81 mg by mouth every evening.     [provider]  cetirizine (ZYRTEC) 10 MG tablet Take 10 mg by mouth every evening.     [provider]  HYDROcodone-acetaminophen (NORCO) 10-325 MG tablet Take 1-2 tablets by mouth every 4 (four) hours as needed for moderate pain. Maximum dose per 24 hours - 8 pills 05/11/19   Kathie Rhodes,  MD  levonorgestrel-ethinyl estradiol (SEASONALE,INTROVALE,JOLESSA) 0.15-0.03 MG tablet Take 1 tablet by mouth every evening.     [provider]  phenazopyridine (PYRIDIUM) 200 MG tablet Take 1 tablet (200 mg total) by mouth 3 (three) times daily as needed for pain. 05/11/19   Kathie Rhodes, MD  propranolol ER (INDERAL LA) 60 MG 24 hr capsule Take 1 capsule (60 mg total) by mouth daily. Patient taking differently: Take 60 mg by mouth every evening.  03/18/17    Evans Lance, MD  SUMAtriptan (IMITREX) 50 MG tablet Take 1 tablet by mouth once. Take as directed for migraines 01/11/17   [provider]    Family History Family History  Problem Relation Age of Onset  . Diabetes Mother        did of complications of C-Diff  . Parkinson's disease Father     Social History Social History   Tobacco Use  . Smoking status: Never Smoker  . Smokeless tobacco: Never Used  Substance Use Topics  . Alcohol use: Yes    Comment: occasional  . Drug use: Never     Allergies   Sulfa antibiotics and Adhesive [tape]   Review of Systems Review of Systems  Constitutional: Negative for appetite change, diaphoresis, fatigue, fever and unexpected weight change.  HENT: Negative for mouth sores.   Eyes: Negative for visual disturbance.  Respiratory: Negative for cough, chest tightness, shortness of breath and wheezing.   Cardiovascular: Negative for chest pain.  Gastrointestinal: Positive for abdominal pain, nausea and vomiting. Negative for constipation and diarrhea.  Endocrine: Negative for polydipsia, polyphagia and polyuria.  Genitourinary: Positive for flank pain. Negative for dysuria, frequency, hematuria and urgency.  Musculoskeletal: Negative for back pain and neck stiffness.  Skin: Negative for rash.  Allergic/Immunologic: Negative for immunocompromised state.  Neurological: Negative for syncope, light-headedness and headaches.  Hematological: Does not bruise/bleed easily.  Psychiatric/Behavioral: Negative for sleep disturbance. The patient is not nervous/anxious.      Physical Exam Updated Vital Signs BP 120/84   Pulse 73   Temp 97.8 F (36.6 C) (Oral)   Resp 18   Ht 5' 11"  (1.803 m)   Wt 104.3 kg   SpO2 100%   BMI 32.08 kg/m   Physical Exam Vitals signs and nursing note reviewed.  Constitutional:      General: She is not in acute distress.    Appearance: She is not diaphoretic.  HENT:     Head: Normocephalic.  Eyes:      General: No scleral icterus.    Conjunctiva/sclera: Conjunctivae normal.  Neck:     Musculoskeletal: Normal range of motion.  Cardiovascular:     Rate and Rhythm: Normal rate and regular rhythm.     Pulses: Normal pulses.          Radial pulses are 2+ on the right side and 2+ on the left side.  Pulmonary:     Effort: No tachypnea, accessory muscle usage, prolonged expiration, respiratory distress or retractions.     Breath sounds: No stridor.     Comments: Equal chest rise. No increased work of breathing. Abdominal:     General: There is no distension.     Palpations: Abdomen is soft.     Tenderness: There is abdominal tenderness in the right upper quadrant. There is guarding. There is no right CVA tenderness, left CVA tenderness or rebound. Positive signs include Murphy's sign.  Musculoskeletal:     Comments: Moves all extremities equally and without difficulty.  Skin:  General: Skin is warm and dry.     Capillary Refill: Capillary refill takes less than 2 seconds.  Neurological:     Mental Status: She is alert.     GCS: GCS eye subscore is 4. GCS verbal subscore is 5. GCS motor subscore is 6.     Comments: Speech is clear and goal oriented.  Psychiatric:        Mood and Affect: Mood normal.      ED Treatments / Results  Labs (all labs ordered are listed, but only abnormal results are displayed) Labs Reviewed  COMPREHENSIVE METABOLIC PANEL - Abnormal; Notable for the following components:      Result Value   Glucose, Bld 124 (*)    AST 92 (*)    ALT 95 (*)    All other components within normal limits  CBC - Abnormal; Notable for the following components:   WBC 17.7 (*)    Hemoglobin 15.5 (*)    HCT 48.5 (*)    All other components within normal limits  URINALYSIS, ROUTINE W REFLEX MICROSCOPIC - Abnormal; Notable for the following components:   APPearance HAZY (*)    Hgb urine dipstick MODERATE (*)    Protein, ur 100 (*)    Bacteria, UA RARE (*)    All other  components within normal limits  LIPASE, BLOOD  I-STAT BETA HCG BLOOD, ED ([facility], [facility], [facility] ONLY)    EKG EKG Interpretation  Date/Time:  Saturday August 18 2019 05:30:35 EST Ventricular Rate:  68 PR Interval:    QRS Duration: 90 QT Interval:  383 QTC Calculation: 408 R Axis:   81 Text Interpretation: Sinus rhythm No significant change since last tracing Confirmed by Pryor Curia [phone]) on 08/18/2019 5:49:20 AM    Procedures Ultrasound ED Peripheral IV (Provider)  Date/Time: 08/18/2019 7:20 AM Performed by: Abigail Butts, PA-C Authorized by: Abigail Butts, PA-C   Procedure details:    Indications: hydration, multiple failed IV attempts and poor IV access     Skin Prep: chlorhexidine gluconate     Location:  Right AC   Angiocath:  20 G   Bedside Ultrasound Guided: Yes     Images: archived     Patient tolerated procedure without complications: Yes     Dressing applied: Yes     (including critical care time)  Medications Ordered in ED Medications  morphine 4 MG/ML injection 4 mg (4 mg Intravenous Given 08/18/19 0617)  sodium chloride flush (NS) 0.9 % injection 3 mL (3 mLs Intravenous Given 08/18/19 0608)  ondansetron (ZOFRAN) injection 4 mg (4 mg Intravenous Given 08/18/19 0606)     Initial Impression / Assessment and Plan / ED Course  I have reviewed the triage vital signs and the nursing notes.  Pertinent labs & imaging results that were available during my care of the patient were reviewed by me and considered in my medical decision making (see chart for details).  Clinical Course as of Aug 18 731  Sat Aug 18, 2019  0721 Leukocytosis noted  WBC(!): 17.7 [HM]  0721 Elevated AST/ALT.  Concern for cholecystitis.  Ultrasound pending  AST(!): 92 [HM]  0732 Noted proteinuria  Protein(!): 100 [HM]  0732 Moderate hemoglobin  Hgb urine dipstickMarland Kitchen): MODERATE [HM]    Clinical Course User Index [HM] Kalika Smay, Jarrett Soho, [place]       Patient presents with  right upper quadrant abdominal pain radiating into her right flank.  Leukocytosis and elevation in AST and ALT give rise to concern for  possible cholecystitis.  She does have a positive Murphy sign.  Vomiting prior to arrival.  Pain medication and Zofran given here in the emergency department.  Ultrasound pending.  Patient is not currently septic.   7:31 AM At shift change care was transferred to Janetta Hora, PA-C who will follow pending studies, re-evaulate and determine disposition.    Final Clinical Impressions(s) / ED Diagnoses   Final diagnoses:  RUQ abdominal pain  Microscopic hematuria  Proteinuria, unspecified type    ED Discharge Orders    None       Agapito Games 08/18/19 [place], Delice Bison, DO 08/18/19 [phone]

## 2019-08-18 NOTE — ED Provider Notes (Signed)
48 year old female presents with RUQ pain that started at 2AM with associated N/V. Labs and RUQ [place] are pending at shift change   CBC is remarkable for leukocytosis of 17.7. CMP is remarkable for very mild elevation of LFTs (AST 92, ALT 95) with normal bilirubin. RUQ US shows cholelithiasis without cholecystitis and multiple liver lesions which is already being worked up as an outpatient. Pain is improved after pain meds but not resolved. She still hast moderate tenderness on exam. Will discuss with general surgery.  Discussed with Dr. Catalina Antigua with surgery. He recommends admission, COVID test and abx. Plan was discussed with patient and she is in agreement with plan.   Recardo Evangelist, PA-C 08/18/19 8325    Blanchie Dessert, MD 08/18/19 [phone]

## 2019-08-19 ENCOUNTER — Observation Stay (HOSPITAL_COMMUNITY): Payer: BC Managed Care – PPO | Admitting: Anesthesiology

## 2019-08-19 ENCOUNTER — Encounter (HOSPITAL_COMMUNITY): Admission: EM | Disposition: A | Discharge status: Home / Self Care | Payer: Self-pay | Source: Home / Self Care

## 2019-08-19 ENCOUNTER — Encounter (HOSPITAL_COMMUNITY): Payer: Self-pay | Admitting: *Deleted

## 2019-08-19 ENCOUNTER — Observation Stay (HOSPITAL_COMMUNITY): Payer: BC Managed Care – PPO

## 2019-08-19 DIAGNOSIS — Z833 Family history of diabetes mellitus: Secondary | ICD-10-CM | POA: Diagnosis not present

## 2019-08-19 DIAGNOSIS — Z23 Encounter for immunization: Secondary | ICD-10-CM | POA: Diagnosis not present

## 2019-08-19 DIAGNOSIS — Z91048 Other nonmedicinal substance allergy status: Secondary | ICD-10-CM | POA: Diagnosis not present

## 2019-08-19 DIAGNOSIS — G43909 Migraine, unspecified, not intractable, without status migrainosus: Secondary | ICD-10-CM | POA: Diagnosis present

## 2019-08-19 DIAGNOSIS — I10 Essential (primary) hypertension: Secondary | ICD-10-CM | POA: Diagnosis present

## 2019-08-19 DIAGNOSIS — Z882 Allergy status to sulfonamides status: Secondary | ICD-10-CM | POA: Diagnosis not present

## 2019-08-19 DIAGNOSIS — Z820 Family history of epilepsy and other diseases of the nervous system: Secondary | ICD-10-CM | POA: Diagnosis not present

## 2019-08-19 DIAGNOSIS — Z20828 Contact with and (suspected) exposure to other viral communicable diseases: Secondary | ICD-10-CM | POA: Diagnosis present

## 2019-08-19 DIAGNOSIS — R1011 Right upper quadrant pain: Secondary | ICD-10-CM | POA: Diagnosis present

## 2019-08-19 DIAGNOSIS — Z7982 Long term (current) use of aspirin: Secondary | ICD-10-CM | POA: Diagnosis not present

## 2019-08-19 DIAGNOSIS — K8010 Calculus of gallbladder with chronic cholecystitis without obstruction: Secondary | ICD-10-CM | POA: Diagnosis present

## 2019-08-19 HISTORY — PX: CHOLECYSTECTOMY: SHX55

## 2019-08-19 LAB — COMPREHENSIVE METABOLIC PANEL
ALT: 282 U/L — ABNORMAL HIGH (ref 0–44)
AST: 206 U/L — ABNORMAL HIGH (ref 15–41)
Albumin: 3.4 g/dL — ABNORMAL LOW (ref 3.5–5.0)
Alkaline Phosphatase: 39 U/L (ref 38–126)
Anion gap: 10 (ref 5–15)
BUN: 12 mg/dL (ref 6–20)
CO2: 20 mmol/L — ABNORMAL LOW (ref 22–32)
Calcium: 8.2 mg/dL — ABNORMAL LOW (ref 8.9–10.3)
Chloride: 106 mmol/L (ref 98–111)
Creatinine, Ser: 0.81 mg/dL (ref 0.44–1.00)
GFR calc Af Amer: >60 mL/min (ref >60)
GFR calc non Af Amer: >60 mL/min (ref >60)
Glucose, Bld: 98 mg/dL (ref 70–99)
Potassium: 3.9 mmol/L (ref 3.5–5.1)
Sodium: 136 mmol/L (ref 135–145)
Total Bilirubin: 0.9 mg/dL (ref 0.3–1.2)
Total Protein: 6.3 g/dL — ABNORMAL LOW (ref 6.5–8.1)

## 2019-08-19 LAB — SURGICAL PCR SCREEN
MRSA, PCR: NEGATIVE
Staphylococcus aureus: NEGATIVE

## 2019-08-19 LAB — SARS CORONAVIRUS 2 (TAT 6-24 HRS): SARS Coronavirus 2: NEGATIVE

## 2019-08-19 SURGERY — LAPAROSCOPIC CHOLECYSTECTOMY WITH INTRAOPERATIVE CHOLANGIOGRAM
Anesthesia: General

## 2019-08-19 MED ORDER — TRAMADOL HCL 50 MG PO TABS: 50.0000 mg | ORAL_TABLET | Freq: Four times a day (QID) | ORAL | Status: DC | PRN

## 2019-08-19 MED ORDER — ONDANSETRON HCL 4 MG/2ML IJ SOLN
INTRAMUSCULAR | Status: DC | PRN
  Administered 2019-08-19: 4 mg via INTRAVENOUS

## 2019-08-19 MED ORDER — ONDANSETRON HCL 4 MG/2ML IJ SOLN
INTRAMUSCULAR | Status: AC
  Filled 2019-08-19: qty 2

## 2019-08-19 MED ORDER — ONDANSETRON 4 MG PO TBDP: 4.0000 mg | ORAL_TABLET | Freq: Four times a day (QID) | ORAL | Status: DC | PRN

## 2019-08-19 MED ORDER — PROPOFOL 10 MG/ML IV BOLUS
INTRAVENOUS | Status: AC
  Filled 2019-08-19: qty 20

## 2019-08-19 MED ORDER — ONDANSETRON HCL 4 MG/2ML IJ SOLN: 4.0000 mg | Freq: Four times a day (QID) | INTRAMUSCULAR | Status: DC | PRN

## 2019-08-19 MED ORDER — HYDROMORPHONE HCL 1 MG/ML IJ SOLN: 1.0000 mg | INTRAMUSCULAR | Status: DC | PRN

## 2019-08-19 MED ORDER — FENTANYL CITRATE (PF) 100 MCG/2ML IJ SOLN
INTRAMUSCULAR | Status: AC
  Filled 2019-08-19: qty 2

## 2019-08-19 MED ORDER — SODIUM CHLORIDE 0.9 % IV SOLN
INTRAVENOUS | Status: AC
  Filled 2019-08-19: qty 20

## 2019-08-19 MED ORDER — PHENYLEPHRINE 40 MCG/ML (10ML) SYRINGE FOR IV PUSH (FOR BLOOD PRESSURE SUPPORT)
PREFILLED_SYRINGE | INTRAVENOUS | Status: AC
  Filled 2019-08-19: qty 10

## 2019-08-19 MED ORDER — OXYCODONE HCL 5 MG PO TABS: 5.0000 mg | ORAL_TABLET | Freq: Once | ORAL | Status: DC | PRN

## 2019-08-19 MED ORDER — PROPOFOL 10 MG/ML IV BOLUS
INTRAVENOUS | Status: DC | PRN
  Administered 2019-08-19: 200 mg via INTRAVENOUS

## 2019-08-19 MED ORDER — MIDAZOLAM HCL 2 MG/2ML IJ SOLN
INTRAMUSCULAR | Status: AC
  Filled 2019-08-19: qty 2

## 2019-08-19 MED ORDER — SUGAMMADEX SODIUM 200 MG/2ML IV SOLN
INTRAVENOUS | Status: DC | PRN
  Administered 2019-08-19: 200 mg via INTRAVENOUS

## 2019-08-19 MED ORDER — MIDAZOLAM HCL 5 MG/5ML IJ SOLN
INTRAMUSCULAR | Status: DC | PRN
  Administered 2019-08-19: 2 mg via INTRAVENOUS

## 2019-08-19 MED ORDER — IOHEXOL 300 MG/ML  SOLN
INTRAMUSCULAR | Status: DC | PRN
  Administered 2019-08-19: 7.5 mL

## 2019-08-19 MED ORDER — ACETAMINOPHEN 650 MG RE SUPP: 650.0000 mg | Freq: Four times a day (QID) | RECTAL | Status: DC | PRN

## 2019-08-19 MED ORDER — LIDOCAINE 2% (20 MG/ML) 5 ML SYRINGE
INTRAMUSCULAR | Status: DC | PRN
  Administered 2019-08-19: 80 mg via INTRAVENOUS

## 2019-08-19 MED ORDER — ROCURONIUM BROMIDE 50 MG/5ML IV SOSY
PREFILLED_SYRINGE | INTRAVENOUS | Status: DC | PRN
  Administered 2019-08-19: 5 mg via INTRAVENOUS
  Administered 2019-08-19: 10 mg via INTRAVENOUS
  Administered 2019-08-19: 50 mg via INTRAVENOUS

## 2019-08-19 MED ORDER — BUPIVACAINE-EPINEPHRINE 0.5% -1:200000 IJ SOLN
INTRAMUSCULAR | Status: DC | PRN
  Administered 2019-08-19: 26 mL

## 2019-08-19 MED ORDER — LACTATED RINGERS IR SOLN
Status: DC | PRN
  Administered 2019-08-19: 1

## 2019-08-19 MED ORDER — PROPRANOLOL HCL ER 60 MG PO CP24
60.0000 mg | ORAL_CAPSULE | Freq: Every evening | ORAL | Status: DC
  Administered 2019-08-19: 60 mg via ORAL
  Filled 2019-08-19 (×2): qty 1

## 2019-08-19 MED ORDER — SUCCINYLCHOLINE CHLORIDE 200 MG/10ML IV SOSY
PREFILLED_SYRINGE | INTRAVENOUS | Status: DC | PRN
  Administered 2019-08-19: 120 mg via INTRAVENOUS

## 2019-08-19 MED ORDER — ACETAMINOPHEN 325 MG PO TABS: 650.0000 mg | ORAL_TABLET | Freq: Four times a day (QID) | ORAL | Status: DC | PRN

## 2019-08-19 MED ORDER — PHENYLEPHRINE 40 MCG/ML (10ML) SYRINGE FOR IV PUSH (FOR BLOOD PRESSURE SUPPORT)
PREFILLED_SYRINGE | INTRAVENOUS | Status: DC | PRN
  Administered 2019-08-19 (×2): 120 ug via INTRAVENOUS

## 2019-08-19 MED ORDER — ROCURONIUM BROMIDE 10 MG/ML (PF) SYRINGE
PREFILLED_SYRINGE | INTRAVENOUS | Status: AC
  Filled 2019-08-19: qty 10

## 2019-08-19 MED ORDER — 0.9 % SODIUM CHLORIDE (POUR BTL) OPTIME
TOPICAL | Status: DC | PRN
  Administered 2019-08-19: 1000 mL

## 2019-08-19 MED ORDER — DEXAMETHASONE SODIUM PHOSPHATE 10 MG/ML IJ SOLN
INTRAMUSCULAR | Status: AC
  Filled 2019-08-19: qty 1

## 2019-08-19 MED ORDER — OXYCODONE HCL 5 MG/5ML PO SOLN: 5.0000 mg | Freq: Once | ORAL | Status: DC | PRN

## 2019-08-19 MED ORDER — KCL IN DEXTROSE-NACL 20-5-0.45 MEQ/L-%-% IV SOLN
INTRAVENOUS | Status: DC
  Administered 2019-08-19 – 2019-08-20 (×2): via INTRAVENOUS
  Filled 2019-08-19 (×3): qty 1000

## 2019-08-19 MED ORDER — SUCCINYLCHOLINE CHLORIDE 200 MG/10ML IV SOSY
PREFILLED_SYRINGE | INTRAVENOUS | Status: AC
  Filled 2019-08-19: qty 10

## 2019-08-19 MED ORDER — LACTATED RINGERS IV SOLN
INTRAVENOUS | Status: DC | PRN
  Administered 2019-08-19 (×2): via INTRAVENOUS

## 2019-08-19 MED ORDER — DEXAMETHASONE SODIUM PHOSPHATE 10 MG/ML IJ SOLN
INTRAMUSCULAR | Status: DC | PRN
  Administered 2019-08-19: 10 mg via INTRAVENOUS

## 2019-08-19 MED ORDER — HYDROCODONE-ACETAMINOPHEN 5-325 MG PO TABS
1.0000 | ORAL_TABLET | ORAL | Status: DC | PRN
  Administered 2019-08-19: 1 via ORAL
  Administered 2019-08-19 (×2): 2 via ORAL
  Filled 2019-08-19 (×2): qty 2
  Filled 2019-08-19: qty 1

## 2019-08-19 MED ORDER — FENTANYL CITRATE (PF) 100 MCG/2ML IJ SOLN
25.0000 ug | INTRAMUSCULAR | Status: DC | PRN
  Administered 2019-08-19 (×2): 50 ug via INTRAVENOUS

## 2019-08-19 MED ORDER — BUPIVACAINE-EPINEPHRINE 0.25% -1:200000 IJ SOLN
INTRAMUSCULAR | Status: AC
  Filled 2019-08-19: qty 1

## 2019-08-19 MED ORDER — FENTANYL CITRATE (PF) 250 MCG/5ML IJ SOLN
INTRAMUSCULAR | Status: AC
  Filled 2019-08-19: qty 5

## 2019-08-19 MED ORDER — LIDOCAINE 2% (20 MG/ML) 5 ML SYRINGE
INTRAMUSCULAR | Status: AC
  Filled 2019-08-19: qty 5

## 2019-08-19 MED ORDER — FENTANYL CITRATE (PF) 100 MCG/2ML IJ SOLN
INTRAMUSCULAR | Status: DC | PRN
  Administered 2019-08-19 (×5): 50 ug via INTRAVENOUS

## 2019-08-19 SURGICAL SUPPLY — 38 items
ADH SKN CLS APL DERMABOND .7 (GAUZE/BANDAGES/DRESSINGS) ×1
APL PRP STRL LF DISP 70% ISPRP (MISCELLANEOUS) ×2
APPLIER CLIP ROT 10 11.4 M/L (STAPLE) ×2
APR CLP MED LRG 11.4X10 (STAPLE) ×1
BAG SPEC RTRVL LRG 6X4 10 (ENDOMECHANICALS) ×1
CABLE HIGH FREQUENCY MONO STRZ (ELECTRODE) ×2 IMPLANT
CHLORAPREP W/TINT 26 (MISCELLANEOUS) ×4 IMPLANT
CLIP APPLIE ROT 10 11.4 M/L (STAPLE) ×1 IMPLANT
COVER MAYO STAND STRL (DRAPES) ×2 IMPLANT
COVER SURGICAL LIGHT HANDLE (MISCELLANEOUS) ×2 IMPLANT
COVER WAND RF STERILE (DRAPES) IMPLANT
DECANTER SPIKE VIAL GLASS SM (MISCELLANEOUS) ×2 IMPLANT
DERMABOND ADVANCED (GAUZE/BANDAGES/DRESSINGS) ×1
DERMABOND ADVANCED .7 DNX12 (GAUZE/BANDAGES/DRESSINGS) IMPLANT
DRAPE C-ARM 42X120 X-RAY (DRAPES) ×2 IMPLANT
ELECT REM PT RETURN 15FT ADLT (MISCELLANEOUS) ×2 IMPLANT
GAUZE SPONGE 2X2 8PLY STRL LF (GAUZE/BANDAGES/DRESSINGS) ×1 IMPLANT
GLOVE SURG ORTHO 8.0 STRL STRW (GLOVE) ×2 IMPLANT
GOWN STRL REUS W/TWL XL LVL3 (GOWN DISPOSABLE) ×4 IMPLANT
HEMOSTAT SNOW SURGICEL 2X4 (HEMOSTASIS) ×1 IMPLANT
HEMOSTAT SURGICEL 4X8 (HEMOSTASIS) IMPLANT
KIT BASIN OR (CUSTOM PROCEDURE TRAY) ×2 IMPLANT
KIT TURNOVER KIT A (KITS) IMPLANT
POUCH SPECIMEN RETRIEVAL 10MM (ENDOMECHANICALS) ×2 IMPLANT
SCISSORS LAP 5X35 DISP (ENDOMECHANICALS) ×2 IMPLANT
SET CHOLANGIOGRAPH MIX (MISCELLANEOUS) ×2 IMPLANT
SET IRRIG TUBING LAPAROSCOPIC (IRRIGATION / IRRIGATOR) ×2 IMPLANT
SET TUBE SMOKE EVAC HIGH FLOW (TUBING) IMPLANT
SLEEVE XCEL OPT CAN 5 100 (ENDOMECHANICALS) ×2 IMPLANT
SPONGE GAUZE 2X2 STER 10/PKG (GAUZE/BANDAGES/DRESSINGS) ×1
STRIP CLOSURE SKIN 1/2X4 (GAUZE/BANDAGES/DRESSINGS) ×2 IMPLANT
SUT MNCRL AB 4-0 PS2 18 (SUTURE) ×2 IMPLANT
TOWEL OR 17X26 10 PK STRL BLUE (TOWEL DISPOSABLE) ×2 IMPLANT
TOWEL OR NON WOVEN STRL DISP B (DISPOSABLE) ×2 IMPLANT
TRAY LAPAROSCOPIC (CUSTOM PROCEDURE TRAY) ×2 IMPLANT
TROCAR BLADELESS OPT 5 100 (ENDOMECHANICALS) ×2 IMPLANT
TROCAR XCEL BLUNT TIP 100MML (ENDOMECHANICALS) ×2 IMPLANT
TROCAR XCEL NON-BLD 11X100MML (ENDOMECHANICALS) ×2 IMPLANT

## 2019-08-19 NOTE — Anesthesia Preprocedure Evaluation (Signed)
Anesthesia Evaluation  Patient identified by MRN, date of birth, ID band Patient awake    Reviewed: Allergy & Precautions, H&P , NPO status , Patient's Chart, lab work & pertinent test results  Airway Mallampati: II   Neck ROM: full    Dental   Pulmonary neg pulmonary ROS,    breath sounds clear to auscultation       Cardiovascular hypertension, + dysrhythmias Supra Ventricular Tachycardia  Rhythm:regular Rate:Normal  S/p EP ablation (Dr Lovena Le)   Neuro/Psych  Headaches,    GI/Hepatic cholelithiasis   Endo/Other  obese  Renal/GU stones     Musculoskeletal   Abdominal   Peds  Hematology   Anesthesia Other Findings   Reproductive/Obstetrics                             Anesthesia Physical Anesthesia Plan  ASA: II  Anesthesia Plan: General   Post-op Pain Management:    Induction: Intravenous  PONV Risk Score and Plan: 3 and Ondansetron, Dexamethasone, Midazolam and Treatment may vary due to age or medical condition  Airway Management Planned: Oral ETT  Additional Equipment:   Intra-op Plan:   Post-operative Plan: Extubation in OR  Informed Consent: I have reviewed the patients History and Physical, chart, labs and discussed the procedure including the risks, benefits and alternatives for the proposed anesthesia with the patient or authorized representative who has indicated his/her understanding and acceptance.       Plan Discussed with: CRNA, Anesthesiologist and Surgeon  Anesthesia Plan Comments:         Anesthesia Quick Evaluation

## 2019-08-19 NOTE — Op Note (Signed)
Procedure Note  Pre-operative Diagnosis:  Cholecystitis, cholelithiasis  Post-operative Diagnosis:  same  Surgeon:  Armandina Gemma, MD  Assistant:  Neysa Bonito, MD   Procedure:  Laparoscopic cholecystectomy with intra-operative cholangiography  Anesthesia:  General  Estimated Blood Loss:  minimal  Drains: none         Specimen: gallbladder to pathology  Indications:  Patient is a 48 year old female who presents to the emergency department with epigastric abdominal pain, nausea, and emesis.  Patient has a known history of gallstones.  She developed symptoms on the night prior to admission.  Pain persisted and she presented for evaluation.  Afebrile.  WBC elevated at 17.7.  Ultrasound shows cholelithiasis.  There is no wall thickening or evidence of pericholecystic fluid.  Liver enzymes show mild elevation of transaminases.  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  These are being followed with sequential MRI scanning.  Patient denies any history of hepatitis or pancreatitis.  She denies jaundice or acholic stools.  She denies fevers or chills.  Previous abdominal surgery includes laparoscopy for endometriosis.  There is a family history of gallbladder disease in multiple family members including her mother.  Patient now presents for surgical evaluation and management for symptomatic cholelithiasis and cholecystitis.  Procedure Details:  The patient was seen in the pre-op holding area. The risks, benefits, complications, treatment options, and expected outcomes were previously discussed with the patient. The patient agreed with the proposed plan and has signed the informed consent form.  The patient was transported to operating room # 1 at the [facility]. The patient was placed in the supine position on the operating room table. Following induction of general anesthesia, the abdomen was prepped and draped in the usual aseptic  fashion.  An incision was made in the skin near the umbilicus. The midline fascia was incised and the peritoneal cavity was entered and a Hasson cannula was introduced under direct vision. The cannula was secured with a 0-Vicryl pursestring suture. Pneumoperitoneum was established with carbon dioxide. Additional cannulae were introduced under direct vision along the right costal margin in the midline, mid-clavicular line, and anterior axillary line.  Upon exploring the abdomen with the laparoscope, there is dark fluid consistent with old blood lying in the gutter lateral to the right lobe of the liver.  The gallbladder appears to be somewhat intrahepatic near the fundus.  Upon elevating the gallbladder, there is a dark appearing masslike structure in the lateral inferior portion of the liver.  This was initially felt to represent hemangioma but does not typically appear to be a hemangioma.  There is no active bleeding.   The gallbladder was identified and the fundus grasped and retracted cephalad. Adhesions were taken down bluntly and the electrocautery was utilized as needed, taking care not to involve any adjacent structures. The infundibulum was grasped and retracted laterally, exposing the peritoneum overlying the triangle of Calot. The peritoneum was incised and structures exposed with blunt dissection. The cystic duct was clearly identified, bluntly dissected circumferentially, and clipped at the neck of the gallbladder.  An incision was made in the cystic duct and the cholangiogram catheter introduced. The catheter was secured using an ligaclip.  Real-time cholangiography was performed using C-arm fluoroscopy.  There was rapid filling of a normal caliber common bile duct.  There was reflux of contrast into the left and right hepatic ductal systems.  There was free flow distally into the duodenum without filling defect or  obstruction.  The catheter was removed from the peritoneal cavity.  The cystic  duct was then ligated with ligaclips and divided. The cystic artery was identified, dissected circumferentially, ligated with ligaclips, and divided.  The gallbladder was dissected away from the gallbladder bed using the electrocautery for hemostasis. The gallbladder was completely removed from the liver and placed into an endocatch bag. The gallbladder was removed in the endocatch bag through the umbilical port site and submitted to pathology for review.  The right upper quadrant was irrigated and the gallbladder bed was inspected. Hemostasis was achieved with the electrocautery.  A sheet of snow hemostatic agent is placed around the right inferior portion of the liver.  No further bleeding or drainage is noted.  Cannulae were removed under direct vision and good hemostasis was noted. Pneumoperitoneum was released and the majority of the carbon dioxide evacuated. The umbilical wound was irrigated and the fascia was then closed with the pursestring suture.  Local anesthetic was infiltrated at all port sites. Skin incisions were closed with 4-0 Monocril subcuticular sutures and Dermabond was applied.  Instrument, sponge, and needle counts were correct at the conclusion of the case.  The patient was awakened from anesthesia and brought to the recovery room in stable condition.  The patient tolerated the procedure well.   Armandina Gemma, MD [facility] Office: [phone]

## 2019-08-19 NOTE — Transfer of Care (Signed)
Immediate Anesthesia Transfer of Care Note  Patient: [name] [name]  Procedure(s) Performed: LAPAROSCOPIC CHOLECYSTECTOMY WITH INTRAOPERATIVE CHOLANGIOGRAM (N/A )  Patient Location: PACU  Anesthesia Type:General  Level of Consciousness: awake, alert  and oriented  Airway & Oxygen Therapy: Patient Spontanous Breathing and Patient connected to face mask oxygen  Post-op Assessment: Report given to RN and Post -op Vital signs reviewed and stable  Post vital signs: Reviewed and stable  Last Vitals:  Vitals Value Taken Time  BP 114/72 08/19/19 0925  Temp    Pulse 95 08/19/19 0926  Resp 19 08/19/19 0926  SpO2 100 % 08/19/19 0926  Vitals shown include unvalidated device data.  Last Pain:  Vitals:   08/19/19 0617  TempSrc: Oral  PainSc:       Patients Stated Pain Goal: 2 (89/79/15 0413)  Complications: No apparent anesthesia complications

## 2019-08-19 NOTE — Anesthesia Procedure Notes (Signed)
Procedure Name: Intubation Date/Time: 08/19/2019 8:06 AM Performed by: Maxwell Caul, CRNA Pre-anesthesia Checklist: Patient identified, Emergency Drugs available, Suction available and Patient being monitored Patient Re-evaluated:Patient Re-evaluated prior to induction Oxygen Delivery Method: Circle system utilized Preoxygenation: Pre-oxygenation with 100% oxygen Induction Type: IV induction Ventilation: Mask ventilation without difficulty Laryngoscope Size: Mac and 3 Grade View: Grade II Tube type: Oral Tube size: 7.5 mm Number of attempts: 2 Airway Equipment and Method: Stylet Placement Confirmation: ETT inserted through vocal cords under direct vision,  positive ETCO2 and breath sounds checked- equal and bilateral Secured at: 21 cm Tube secured with: Tape Dental Injury: Teeth and Oropharynx as per pre-operative assessment

## 2019-08-20 ENCOUNTER — Encounter (HOSPITAL_COMMUNITY): Payer: Self-pay | Admitting: Surgery

## 2019-08-20 MED ORDER — OXYCODONE HCL 5 MG PO TABS: 5.0000 mg | ORAL_TABLET | ORAL | 0 refills | Status: DC | PRN

## 2019-08-20 MED ORDER — ACETAMINOPHEN 500 MG PO TABS: ORAL_TABLET | ORAL | 0 refills | Status: DC

## 2019-08-20 MED ORDER — IBUPROFEN 200 MG PO TABS: 600.0000 mg | ORAL_TABLET | Freq: Four times a day (QID) | ORAL | Status: DC | PRN

## 2019-08-20 MED ORDER — IBUPROFEN 200 MG PO TABS: ORAL_TABLET | ORAL | 0 refills | Status: DC

## 2019-08-20 MED ORDER — OXYCODONE HCL 5 MG PO TABS: 5.0000 mg | ORAL_TABLET | ORAL | Status: DC | PRN

## 2019-08-20 MED ORDER — ACETAMINOPHEN 500 MG PO TABS
1000.0000 mg | ORAL_TABLET | Freq: Three times a day (TID) | ORAL | Status: DC
  Administered 2019-08-20: 1000 mg via ORAL
  Filled 2019-08-20: qty 2

## 2019-08-20 NOTE — Progress Notes (Signed)
Discharge instructions given to pt and all questions were answered.  

## 2019-08-20 NOTE — Progress Notes (Signed)
1 Day Post-Op    CC: Abdominal pain  Subjective: Patient is doing well this a.m.  Still pretty sore.  She is only been up to the bathroom.  And on clear liquids.  Her port sites all look fine.  Objective: Vital signs in last 24 hours: Temp:  [98 F (36.7 C)-98.9 F (37.2 C)] 98.1 F (36.7 C) (11/09 0635) Pulse Rate:  [79-98] 79 (11/09 0635) Resp:  [15-20] 18 (11/09 0635) BP: (111-139)/(68-89) 115/72 (11/09 0635) SpO2:  [92 %-100 %] 99 % (11/09 0635) Last BM Date: 08/17/19 840 p.o. 1800 IV 3850 urine Afebrile vital signs are stable No labs this a.m. Abdominal ultrasound 11/7: Small stones in the gallbladder lumen no gallbladder wall thickening or pericholecystic fluid negative sonographic Murphy sign. Intake/Output from previous day: 11/08 0701 - 11/09 0700 In: 2745.3 [P.O.:840; I.V.:1805.3; IV Piggyback:100] Out: 2122 [Urine:3850; Blood:25] Intake/Output this shift: No intake/output data recorded.  General appearance: alert, cooperative and no distress Resp: clear to auscultation bilaterally GI: Soft, sore, port sites all look good.  Lab Results:  Recent Labs    08/18/19 0605  WBC 17.7*  HGB 15.5*  HCT 48.5*  PLT 226    BMET Recent Labs    08/18/19 0605 08/19/19 0333  NA 139 136  K 5.0 3.9  CL 105 106  CO2 23 20*  GLUCOSE 124* 98  BUN 18 12  CREATININE 0.83 0.81  CALCIUM 9.0 8.2*   PT/INR No results for input(s): LABPROT, INR in the last 72 hours.  Recent Labs  Lab 08/18/19 0605 08/19/19 0333  AST 92* 206*  ALT 95* 282*  ALKPHOS 40 39  BILITOT 0.7 0.9  PROT 7.2 6.3*  ALBUMIN 4.0 3.4*     Lipase     Component Value Date/Time   LIPASE 38 08/18/2019 0605   Prior to Admission medications   Medication Sig Start Date End Date Taking? Authorizing Provider  aspirin 81 MG tablet Take 81 mg by mouth every evening.    Yes [provider]  cetirizine (ZYRTEC) 10 MG tablet Take 10 mg by mouth every evening.    Yes [provider]   Elderberry 575 MG/5ML SYRP Take 15 mLs by mouth daily.   Yes [provider]  ibuprofen (ADVIL) 200 MG tablet Take 400 mg by mouth every 6 (six) hours as needed for fever, headache or moderate pain.   Yes [provider]  levonorgestrel-ethinyl estradiol (SEASONALE,INTROVALE,JOLESSA) 0.15-0.03 MG tablet Take 1 tablet by mouth every evening.    Yes [provider]  propranolol ER (INDERAL LA) 60 MG 24 hr capsule Take 1 capsule (60 mg total) by mouth daily. Patient taking differently: Take 60 mg by mouth every evening.  03/18/17  Yes Evans Lance, MD  SUMAtriptan (IMITREX) 50 MG tablet Take 1 tablet by mouth every 2 (two) hours as needed for migraine. Take as directed for migraines 01/11/17  Yes [provider]  HYDROcodone-acetaminophen (NORCO) 10-325 MG tablet Take 1-2 tablets by mouth every 4 (four) hours as needed for moderate pain. Maximum dose per 24 hours - 8 pills Patient not taking: Reported on 08/18/2019 05/11/19   Kathie Rhodes, MD  phenazopyridine (PYRIDIUM) 200 MG tablet Take 1 tablet (200 mg total) by mouth 3 (three) times daily as needed for pain. Patient not taking: Reported on 08/18/2019 05/11/19   Kathie Rhodes, MD     Medications: . propranolol ER  60 mg Oral QPM   . 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  Hepatic adenomas would be another possibility if on birth control pills.  Metastatic disease was unlikely.  No morphologic features of cirrhosis.   Cholecystitis/cholelithiasis Laparoscopic cholecystectomy with intraoperative cholangiogram 08/19/2019 Dr. Armandina Gemma (intraoperative cholangiogram showed free flow into the duodenum left and right hepatic ductal systems  without filling defect or obstruction) POD #1  FEN: IV fluids/clear diet ID: Rocephin 11/7-11/05/2019 DVT: SCDs/Lovenox after 1300 Follow-up: [facility]   Plan: Saline lock IV, advance diet, work on pain control.  She lives alone so she will have to wait for friends to come get her from the hospital.  Plan discharge later this a.m. after she has pain controlled with oral pain medications, ambulating safely, tolerating diet.     LOS: 1 day    [name] [name] 08/20/2019 Please see Amion

## 2019-08-20 NOTE — Discharge Summary (Signed)
Physician Discharge Summary  Patient ID: [name] [name] MRN: [id number] DOB/AGE: 46-Apr-1972 36 y.o.  Admit date: 08/18/2019 Discharge date: 08/20/2019  Admission Diagnoses:  Acute Coley cystitis/cholelithiasis Hx SVT Hx hypertension Hx migraines Hx nephrolithiasis Hx endometriosis Hx numerous hepatic lesions; MRI>> hepatic adenoma/nodular hyperplasia  Discharge Diagnoses:  Acute cholecystitis/cholelithiasis Hx SVT Hx hypertension Hx migraines Hx nephrolithiasis Hx endometriosis Hx numerous hepatic lesions; MRI>> hepatic adenoma/nodular hyperplasia    Active Problems:   Cholecystitis, acute with cholelithiasis   PROCEDURES: Laparoscopic cholecystectomy with intraoperative cholangiogram, 08/19/2019 Dr. Alean Rinne Course:  Patient is a 48 year old female who presents to the emergency department with epigastric abdominal pain, nausea, and emesis.  Patient has a known history of gallstones.  She developed symptoms on the night prior to admission.  Pain persisted and she presented for evaluation.  Afebrile.  WBC elevated at 17.7.  Ultrasound shows cholelithiasis.  There is no wall thickening or evidence of pericholecystic fluid.  Liver enzymes show mild elevation of transaminases.  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  These are being followed with sequential MRI scanning.  Patient denies any history of hepatitis or pancreatitis.  She denies jaundice or acholic stools.  She denies fevers or chills.  Previous abdominal surgery includes laparoscopy for endometriosis.  There is a family history of gallbladder disease in multiple family members including her mother.  Patient now presents for surgical evaluation and management for symptomatic cholelithiasis and cholecystitis patient was admitted by Dr. Darnell Level on 08/18/2019.  Patient was taken to the operating room the following a.m.  She tolerated procedure well return to the  floor.  She was started on clear liquids and advance to a regular diet the following a.m.  Her port sites all look good and she was ready for discharge in the a.m. of her first postoperative day.  She will follow-up in the office in 3 weeks.  Discharge medications listed below.  CBC Latest Ref Rng & Units 08/18/2019 05/11/2019 03/15/2019  WBC 4.0 - 10.5 K/uL 17.7(H) - 14.9(H)  Hemoglobin 12.0 - 15.0 g/dL 15.5(H) 16.7(H) 15.4(H)  Hematocrit 36.0 - 46.0 % 48.5(H) 49.0(H) 47.3(H)  Platelets 150 - 400 K/uL 226 - 246   CMP Latest Ref Rng & Units 08/19/2019 08/18/2019 05/11/2019  Glucose 70 - 99 mg/dL 98 557(D) 97  BUN 6 - 20 mg/dL 12 18 -  Creatinine 2.20 - 1.00 mg/dL 2.54 2.70 -  Sodium 623 - 145 mmol/L 136 139 137  Potassium 3.5 - 5.1 mmol/L 3.9 5.0 5.6(H)  Chloride 98 - 111 mmol/L 106 105 -  CO2 22 - 32 mmol/L 20(L) 23 -  Calcium 8.9 - 10.3 mg/dL 8.2(L) 9.0 -  Total Protein 6.5 - 8.1 g/dL 6.3(L) 7.2 -  Total Bilirubin 0.3 - 1.2 mg/dL 0.9 0.7 -  Alkaline Phos 38 - 126 U/L 39 40 -  AST 15 - 41 U/L 206(H) 92(H) -  ALT 0 - 44 U/L 282(H) 95(H) -   Condition on discharge: Improved   Disposition: Discharge disposition: 01-Home or Self Care        Allergies as of 08/20/2019      Reactions   Sulfa Antibiotics Other (See Comments)   Other reaction(s): Fever   Adhesive [tape] Itching, Swelling   Redness ,swelling and itching      Medication List    STOP taking these medications   HYDROcodone-acetaminophen 10-325 MG tablet Commonly known as: NORCO  TAKE these medications   acetaminophen 500 MG tablet Commonly known as: TYLENOL You can take 1000 mg of Tylenol/acetaminophen every 8 hours.  You can alternate this with ibuprofen.  You can buy this over-the-counter at any drugstore.  Do not exceed 4000 mg/day it can harm your liver.   aspirin 81 MG tablet Take 81 mg by mouth every evening.   cetirizine 10 MG tablet Commonly known as: ZYRTEC Take 10 mg by mouth every evening.    Elderberry 575 MG/5ML Syrp Take 15 mLs by mouth daily.   ibuprofen 200 MG tablet Commonly known as: ADVIL You can take 2 to 3 tablets every 6 hours as needed for pain.  You can alternate this with plain Tylenol/acetaminophen.  You can buy this over-the-counter at any drugstore. What changed:   how much to take  how to take this  when to take this  reasons to take this  additional instructions   levonorgestrel-ethinyl estradiol 0.15-0.03 MG tablet Commonly known as: SEASONALE Take 1 tablet by mouth every evening.   oxyCODONE 5 MG immediate release tablet Commonly known as: Oxy IR/ROXICODONE Take 1 tablet (5 mg total) by mouth every 4 (four) hours as needed for severe pain or breakthrough pain (Pain not not relieved by Tylenol/ibuprofen).   phenazopyridine 200 MG tablet Commonly known as: Pyridium Take 1 tablet (200 mg total) by mouth 3 (three) times daily as needed for pain.   propranolol ER 60 MG 24 hr capsule Commonly known as: Inderal LA Take 1 capsule (60 mg total) by mouth daily. What changed: when to take this   SUMAtriptan 50 MG tablet Commonly known as: IMITREX Take 1 tablet by mouth every 2 (two) hours as needed for migraine. Take as directed for migraines      Follow-up Information    [facility]  Follow [facility] on 09/11/2019.   Specialty: General Surgery Why: Your appointment is 8:30 AM.  Be at the office 30 minutes early for check-in.  Bring photo ID and insurance information. Contact information: [address] [place] [address] [phone]        Joycelyn Rua, MD Follow up.   Specialty: Family Medicine Why: Call for follow-up appointment for medical issues. Contact information: [address] [place] [address] [phone]           Signed: Sherrie George 08/20/2019, 3:24 PM

## 2019-08-20 NOTE — Discharge Instructions (Signed)
[facility] ______[facility] °LAPAROSCOPIC SURGERY: POST OP INSTRUCTIONS °Always review your discharge instruction sheet given to you by the facility where your surgery was performed. °IF YOU HAVE DISABILITY OR FAMILY LEAVE FORMS, YOU MUST BRING THEM TO THE OFFICE FOR PROCESSING.   °DO NOT GIVE THEM TO YOUR DOCTOR. ° °1. A prescription for pain medication may be given to you upon discharge.  Take your pain medication as prescribed, if needed.  If narcotic pain medicine is not needed, then you may take acetaminophen (Tylenol) or ibuprofen (Advil) as needed. °2. Take your usually prescribed medications unless otherwise directed. °3. If you need a refill on your pain medication, please contact your pharmacy.  They will contact our office to request authorization. Prescriptions will not be filled after 5pm or on week-ends. °4. You should follow a light diet the first few days after arrival home, such as soup and crackers, etc.  Be sure to include lots of fluids daily. °5. Most patients will experience some swelling and bruising in the area of the incisions.  Ice packs will help.  Swelling and bruising can take several days to resolve.  °6. It is common to experience some constipation if taking pain medication after surgery.  Increasing fluid intake and taking a stool softener (such as Colace) will usually help or prevent this problem from occurring.  A mild laxative (Milk of Magnesia or Miralax) should be taken according to package instructions if there are no bowel movements after 48 hours. °7. Unless discharge instructions indicate otherwise, you may remove your bandages 24-48 hours after surgery, and you may shower at that time.  You may have steri-strips (small skin tapes) in place directly over the incision.  These strips should be left on the skin for 7-10 days.  If your surgeon used skin glue on the incision, you may shower in 24 hours.  The glue will flake off over the next 2-3 weeks.  Any sutures or  staples will be removed at the office during your follow-up visit. °8. ACTIVITIES:  You may resume regular (light) daily activities beginning the next day--such as daily self-care, walking, climbing stairs--gradually increasing activities as tolerated.  You may have sexual intercourse when it is comfortable.  Refrain from any heavy lifting or straining until approved by your doctor. °a. You may drive when you are no longer taking prescription pain medication, you can comfortably wear a seatbelt, and you can safely maneuver your car and apply brakes. °b. RETURN TO WORK:  __________________________________________________________ °9. You should see your doctor in the office for a follow-up appointment approximately 2-3 weeks after your surgery.  Make sure that you call for this appointment within a day or two after you arrive home to insure a convenient appointment time. °10. OTHER INSTRUCTIONS: __________________________________________________________________________________________________________________________ __________________________________________________________________________________________________________________________ °WHEN TO CALL YOUR DOCTOR: °1. Fever over 101.0 °2. Inability to urinate °3. Continued bleeding from incision. °4. Increased pain, redness, or drainage from the incision. °5. Increasing abdominal pain ° °The clinic staff is available to answer your questions during regular business hours.  Please don’t hesitate to call and ask to speak to one of the nurses for clinical concerns.  If you have a medical emergency, go to the nearest emergency room or call 911.  A surgeon from [facility] is always on call at the hospital. °[address], [address], [place], [place]  [address] ? [address] [address], [place], [place]   [address] °[phone] ? [phone] ? FAX [phone] °Web site:   [email or web address] °

## 2019-08-21 LAB — SURGICAL PATHOLOGY

## 2019-08-21 NOTE — Anesthesia Postprocedure Evaluation (Signed)
Anesthesia Post Note  Patient: [name] [name]  Procedure(s) Performed: LAPAROSCOPIC CHOLECYSTECTOMY WITH INTRAOPERATIVE CHOLANGIOGRAM (N/A )     Patient location during evaluation: PACU Anesthesia Type: General Level of consciousness: awake and alert Pain management: pain level controlled Vital Signs Assessment: post-procedure vital signs reviewed and stable Respiratory status: spontaneous breathing, nonlabored ventilation, respiratory function stable and patient connected to nasal cannula oxygen Cardiovascular status: blood pressure returned to baseline and stable Postop Assessment: no apparent nausea or vomiting Anesthetic complications: no    Last Vitals:  Vitals:   08/20/19 0227 08/20/19 0635  BP: 117/68 115/72  Pulse: 97 79  Resp: 18 18  Temp: 36.8 C 36.7 C  SpO2: 100% 99%    Last Pain:  Vitals:   08/20/19 0919  TempSrc:   PainSc: [place]

## 2020-05-14 NOTE — Progress Notes (Signed)
New Patient Note  RE: [name] [name] MRN: [id number] DOB: 13-Apr-1971 Date of Office Visit: 05/15/2020  Referring provider: Mitzi Hansen, NP Primary care provider: Joycelyn Rua, MD  Chief Complaint: Urticaria and Oral Swelling  History of Present Illness: I had the pleasure of seeing [name] [name] for initial evaluation at the [facility] on 05/15/2020. She is a 49 y.o. female, who is referred here by Joycelyn Rua, MD for the evaluation of hives. Up to date with COVID-19 vaccine: no  Rash started on July 1st. Patient felt very itchy the night before and woke up with hives on the upper legs.  On July 5th, her lips started swelling.  She went to PCP and had prednisone which helped but then it came back 2 days afterwards.   She has been feeling like there's a lump in her throat and chest at times as well.   Hives can occur anywhere on her body and occurring on a daly basis.  Describes them as itchy, raised, itchy. Individual rashes lasts about less than 1 day. No ecchymosis upon resolution. Associated symptoms include: facial angioedema but no respiratory compromise. Suspected triggers are unknown.   Denies any fevers, chills, changes in medications, foods, personal care products or recent infections. She has tried the following therapies: benadryl with some benefit. Systemic steroids yes.  Previous work up includes: has a swab which showed positive to COVID-19 but then had negative antibody testing via bloodwork. Not up to date with vaccine and had no symptoms when she was swabbed.  Previous history of rash/hives: 15-20 years ago had similar occurrence and resolved after 1 year. At that time the hives developed after she had a bad lung infection. She had skin biopsy done as well which showed urticaria per patient report.   She had cholecystectomy in November 2020.  Patient is up to date with the following cancer screening tests: pap smears, physical exam,  mammogram. Limited red meat consumption.   She follows with cardiology and has appointment later this week as her blood pressure has been labile and had 1 episode of passing out.   Assessment and Plan: [name] is a 49 y.o. female with: Urticaria Started to break out in hives on July 1st.  5 days later developed some facial and hand swelling at times.  No respiratory compromise.  Episodes of feeling like there is something in her throat and chest at times.  The hives occur on a daily basis and they wax and wane.  Completely resolved while on prednisone.  Takes Zyrtec and Benadryl as needed with some benefit.  No specific triggers identified.  15 to 20 years ago had similar occurrence lasting for 1 year.  She had skin biopsy at that time which showed hives.  That episode was possibly triggered by a lung infection.  Denies any recent changes in medications, diet, personal care products or infections.  Cholecystectomy in November 2020.  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 Will get some bloodwork to rule out any other underlying causes.  Discussed that patient's with history of hives are more likely to develop hives later on.  The angioedema episodes can be associated with hives as well.  Start taking famotidine 20mg  twice a day.  Increase cetirizine 10mg  to twice a day.  May take up to 2 tablets twice a day. This may cause drowsiness.   Monitor symptoms.  For mild symptoms you can take over the counter antihistamines such as Benadryl and monitor symptoms  closely. If symptoms worsen or if you have severe symptoms including breathing issues, throat closure, significant swelling, whole body hives, severe diarrhea and vomiting, lightheadedness then seek immediate medical care. . Avoid the following potential triggers: alcohol, tight clothing, NSAIDs.  . If above regimen does not control symptoms then will discuss starting omalizumab injections next.   Angio-edema  See assessment and plan as  above.  Other allergic rhinitis Perennial rhinitis symptoms for 35+ years.  Takes Zyrtec with good benefit.  Previous work-up showed multiple positives per patient report.  No previous allergy injections or ENT evaluation.  Continue environmental control measures.    The zyrtec as above should also help with these symptoms.   Unable to skin test today due to recent antihistamine intake and will check environmental allergy panel via bloodwork.  Return in about 4 weeks (around 06/12/2020).  Meds ordered this encounter  Medications  . famotidine (PEPCID) 20 MG tablet    Sig: Take 1 tablet (20 mg total) by mouth 2 (two) times daily.    Dispense:  60 tablet    Refill:  5    Lab Orders     Alpha-Gal Panel     Allergens w/Total IgE Area 2     C1 esterase inhibitor, functional     C1 Esterase Inhibitor     CBC with Differential/Platelet     Chronic Urticaria     C3 and C4     Tryptase     Thyroid Cascade Profile     Comprehensive metabolic panel     C-reactive protein     ANA w/Reflex  Other allergy screening: Asthma: no Rhino conjunctivitis: yes  She reports symptoms of nasal congestion, sinus pressure and headaches. Symptoms have been going on for 35+ years. The symptoms are present all year around. Headache: yes. She has used zyrtec with good improvement in symptoms.  Previous work up includes: positive to dust mites, pet dander, mold and outdoor allergens per patient report. No previous allergy injection. Previous ENT evaluation: no.  Food allergy: no Medication allergy: yes Hymenoptera allergy: no Urticaria: yes Eczema:no History of recurrent infections suggestive of immunodeficency: no  Diagnostics: Skin Testing: Deferred due to recent antihistamines use.  Past Medical History: Patient Active Problem List   Diagnosis Date Noted  . Urticaria 05/15/2020  . Angio-edema 05/15/2020  . Other allergic rhinitis 05/15/2020  . Cholecystitis, acute with cholelithiasis  08/18/2019  . Migraine without aura and without status migrainosus, not intractable 02/09/2016  . SVT (supraventricular tachycardia) (HCC) 05/31/2015  . History of migraine headaches 05/31/2015  . Essential hypertension 05/31/2015   Past Medical History:  Diagnosis Date  . Angio-edema   . Endometriosis   . History of kidney stones   . History of supraventricular tachycardia 05-09-2019  pt denies any cardiac S&S including no palpitations   03-11-2017   s/p  ablation by dr g. taylor;  primary cardiologist dr Tresa Endo,  pt released on as needed basis  . Hypertension   . Migraines   . Seasonal allergies   . Ureteropelvic junction calculus    left side  . Urticaria    Past Surgical History: Past Surgical History:  Procedure Laterality Date  . CHOLECYSTECTOMY N/A 08/19/2019   Procedure: LAPAROSCOPIC CHOLECYSTECTOMY WITH INTRAOPERATIVE CHOLANGIOGRAM;  Surgeon: Darnell Level, MD;  Location: [facility];  Service: General;  Laterality: N/A;  . EXCISION OF BREAST BIOPSY Right 07-21-2000   dr Ezzard Standing @[facility]   . HOLMIUM LASER APPLICATION  05/11/2019   Procedure: HOLMIUM LASER APPLICATION;  Surgeon: Ihor Gully, MD;  [facility];  Service: Urology;;  . PELVIC LAPAROSCOPY  1990s   endometriosis  . PLANTAR FASCIA SURGERY Left 2018  . SVT ABLATION N/A 03/11/2017   Procedure: SVT Ablation;  Surgeon: Marinus Maw, MD;  Location: [facility];  Service: Cardiovascular;  Laterality: N/A;  . URETEROSCOPY WITH HOLMIUM LASER LITHOTRIPSY Left 05/11/2019   Procedure: URETEROSCOPY,RETROGRADE WITH HOLMIUM LASER LITHOTRIPSY, STENT PLACEMENT;  Surgeon: Ihor Gully, MD;  Location[facility];  Service: Urology;  Laterality: Left;  . WRIST SURGERY Left 2012   cyst removal   Medication List:  Current Outpatient Medications  Medication Sig Dispense Refill  . aspirin 81 MG tablet Take 81 mg by mouth every evening.     . cetirizine (ZYRTEC) 10 MG tablet Take 10 mg by mouth  every evening.     Marland Kitchen levonorgestrel-ethinyl estradiol (SEASONALE,INTROVALE,JOLESSA) 0.15-0.03 MG tablet Take 1 tablet by mouth every evening.     . propranolol ER (INDERAL LA) 60 MG 24 hr capsule Take 1 capsule (60 mg total) by mouth daily. (Patient taking differently: Take 60 mg by mouth every evening. ) 30 capsule 5  . SUMAtriptan (IMITREX) 50 MG tablet Take 1 tablet by mouth every 2 (two) hours as needed for migraine. Take as directed for migraines    . acetaminophen (TYLENOL) 500 MG tablet You can take 1000 mg of Tylenol/acetaminophen every 8 hours.  You can alternate this with ibuprofen.  You can buy this over-the-counter at any drugstore.  Do not exceed 4000 mg/day it can harm your liver. 30 tablet 0  . Elderberry 575 MG/5ML SYRP Take 15 mLs by mouth daily.    . famotidine (PEPCID) 20 MG tablet Take 1 tablet (20 mg total) by mouth 2 (two) times daily. 60 tablet 5  . ibuprofen (ADVIL) 200 MG tablet You can take 2 to 3 tablets every 6 hours as needed for pain.  You can alternate this with plain Tylenol/acetaminophen.  You can buy this over-the-counter at any drugstore. 30 tablet 0  . oxyCODONE (OXY IR/ROXICODONE) 5 MG immediate release tablet Take 1 tablet (5 mg total) by mouth every 4 (four) hours as needed for severe pain or breakthrough pain (Pain not not relieved by Tylenol/ibuprofen). 15 tablet 0  . phenazopyridine (PYRIDIUM) 200 MG tablet Take 1 tablet (200 mg total) by mouth 3 (three) times daily as needed for pain. (Patient not taking: Reported on 08/18/2019) 20 tablet 0   No current facility-administered medications for this visit.   Allergies: Allergies  Allergen Reactions  . Other Rash    Other reaction(s): Fever, Other (See Comments) Other reaction(s): Fever  Other reaction(s): Fever (intolerance) Swelling and itching  . Sulfa Antibiotics Other (See Comments)    Other reaction(s): Fever  . Tape Itching and Swelling    Redness ,swelling and itching Redness ,swelling and  itching   Social History: Social History   Socioeconomic History  . Marital status: Single    Spouse name: Not on file  . Number of children: Not on file  . Years of education: Not on file  . Highest education level: Not on file  Occupational History  . Not on file  Tobacco Use  . Smoking status: Never Smoker  . Smokeless tobacco: Never Used  Vaping Use  . Vaping Use: Never used  Substance and Sexual Activity  . Alcohol use: Yes    Comment: occasional  . Drug use: Never  . Sexual activity: Not  on file  Other Topics Concern  . Not on file  Social History Narrative  . Not on file   Social Determinants of Health   Financial Resource Strain:   . Difficulty of Paying Living Expenses:   Food Insecurity:   . Worried About Programme researcher, broadcasting/film/video in the Last Year:   . Barista in the Last Year:   Transportation Needs:   . Freight forwarder (Medical):   Marland Kitchen Lack of Transportation (Non-Medical):   Physical Activity:   . Days of Exercise per Week:   . Minutes of Exercise per Session:   Stress:   . Feeling of Stress :   Social Connections:   . Frequency of Communication with Friends and Family:   . Frequency of Social Gatherings with Friends and Family:   . Attends Religious Services:   . Active Member of Clubs or Organizations:   . Attends Banker Meetings:   Marland Kitchen Marital Status:    Lives in a 47 year old home. Smoking: denies Occupation: Adult nurse HistorySurveyor, minerals in the house: no Carpet in the family room: no Carpet in the bedroom: yes Heating: gas Cooling: central Pet: no  Family History: Family History  Problem Relation Age of Onset  . Diabetes Mother        did of complications of C-Diff  . Parkinson's disease Father    Problem                               Relation Asthma                                   No  Eczema                                No  Food allergy                          No  Allergic rhino  conjunctivitis     No  Angioedema/urticaria  No  Review of Systems  Constitutional: Negative for appetite change, chills, fever and unexpected weight change.  HENT: Negative for congestion and rhinorrhea.   Eyes: Negative for itching.  Respiratory: Negative for cough, chest tightness, shortness of breath and wheezing.   Cardiovascular: Negative for chest pain.  Gastrointestinal: Negative for abdominal pain.  Genitourinary: Negative for difficulty urinating.  Skin: Positive for rash.  Neurological: Negative for headaches.   Objective: BP 122/74   Pulse 76   Temp (!) 97.5 F (36.4 C) (Temporal)   Resp 17   Ht 6' (1.829 m)   Wt 207 lb 8 oz (94.1 kg)   SpO2 98%   BMI 28.14 kg/m  Body mass index is 28.14 kg/m. Physical Exam Vitals and nursing note reviewed.  Constitutional:      Appearance: Normal appearance. She is well-developed.  HENT:     Head: Normocephalic and atraumatic.     Right Ear: Tympanic membrane and external ear normal.     Left Ear: Tympanic membrane and external ear normal.     Nose: Nose normal.     Mouth/Throat:     Mouth: Mucous membranes are moist.     Pharynx: Oropharynx is clear.     Comments: Slight  left upper lip angioedema Eyes:     Conjunctiva/sclera: Conjunctivae normal.  Cardiovascular:     Rate and Rhythm: Normal rate and regular rhythm.     Heart sounds: Normal heart sounds. No murmur heard.  No friction rub. No gallop.   Pulmonary:     Effort: Pulmonary effort is normal.     Breath sounds: Normal breath sounds. No wheezing, rhonchi or rales.  Musculoskeletal:     Cervical back: Neck supple.  Skin:    General: Skin is warm.     Findings: Rash present.     Comments: One urticarial rash on right antecubital fossa.  Neurological:     Mental Status: She is alert and oriented to person, place, and time.  Psychiatric:        Mood and Affect: Mood normal.        Behavior: Behavior normal.    The plan was reviewed with the  patient/family, and all questions/concerned were addressed.  It was my pleasure to see [name] [name] today and participate in her care. Please feel free to contact me with any questions or concerns.  Sincerely,  Wyline Mood, DO Allergy & Immunology  [facility] office: [phone] [facility] office: [phone] [place] office: [phone]

## 2020-05-15 ENCOUNTER — Ambulatory Visit: Payer: BC Managed Care – PPO | Admitting: Allergy

## 2020-05-15 ENCOUNTER — Encounter: Payer: Self-pay | Admitting: Allergy

## 2020-05-15 ENCOUNTER — Other Ambulatory Visit: Payer: Self-pay

## 2020-05-15 VITALS — BP 122/74 | HR 76 | Temp 97.5°F | Resp 17 | Ht 72.0 in | Wt 207.5 lb

## 2020-05-15 DIAGNOSIS — J3089 Other allergic rhinitis: Secondary | ICD-10-CM | POA: Diagnosis not present

## 2020-05-15 DIAGNOSIS — T783XXA Angioneurotic edema, initial encounter: Secondary | ICD-10-CM | POA: Insufficient documentation

## 2020-05-15 DIAGNOSIS — T783XXD Angioneurotic edema, subsequent encounter: Secondary | ICD-10-CM | POA: Diagnosis not present

## 2020-05-15 DIAGNOSIS — L509 Urticaria, unspecified: Secondary | ICD-10-CM | POA: Diagnosis not present

## 2020-05-15 HISTORY — DX: Other allergic rhinitis: J30.89

## 2020-05-15 MED ORDER — FAMOTIDINE 20 MG PO TABS: 20.0000 mg | ORAL_TABLET | Freq: Two times a day (BID) | ORAL | 5 refills | Status: DC

## 2020-05-15 NOTE — Assessment & Plan Note (Signed)
.   See assessment and plan as above. 

## 2020-05-15 NOTE — Patient Instructions (Addendum)
Hives:  Start taking famotidine 87m twice a day.  See if this helps the sensation in your throat as well.   Increase cetirizine 125mto twice a day.  May take up to 2 tablets twice a day. This may make you drowsy.  Monitor symptoms.   For mild symptoms you can take over the counter antihistamines such as Benadryl and monitor symptoms closely. If symptoms worsen or if you have severe symptoms including breathing issues, throat closure, significant swelling, whole body hives, severe diarrhea and vomiting, lightheadedness then seek immediate medical care. Get bloodwork:  We are ordering labs, so please allow 1-2 weeks for the results to come back. With the newly implemented Cures Act, the labs might be visible to you at the same time that they become visible to me. However, I will not address the results until all of the results are back, so please be patient.  . Avoid the following potential triggers: alcohol, tight clothing, NSAIDs.  . Read about Xolair injections. [email or web address] allergies:  Continue environmental control measures.    The zyrtec as above should also help with these symptoms.   Follow up in 4 weeks or sooner if needed.  Follow up with your cardiologist as scheduled.   Control of House Dust Mite Allergen . Dust mite allergens are a common trigger of allergy and asthma symptoms. While they can be found throughout the house, these microscopic creatures thrive in warm, humid environments such as bedding, upholstered furniture and carpeting. . Because so much time is spent in the bedroom, it is essential to reduce mite levels there.  . Encase pillows, mattresses, and box springs in special allergen-proof fabric covers or airtight, zippered plastic covers.  . Bedding should be washed weekly in hot water (130 F) and dried in a hot dryer. Allergen-proof covers are available for comforters and pillows that can't be regularly washed.  . Wendee Coppthe allergy-proof covers every few months. Minimize clutter in the bedroom. Keep pets out of the bedroom.  . Marland Kitcheneep humidity less than 50% by using a dehumidifier or air conditioning. You can buy a humidity measuring device called a hygrometer to monitor this.  . If possible, replace carpets with hardwood, linoleum, or washable area rugs. If that's not possible, vacuum frequently with a vacuum that has a HEPA filter. . Remove all upholstered furniture and non-washable window drapes from the bedroom. . Remove all non-washable stuffed toys from the bedroom.  Wash stuffed toys weekly. Pet Allergen Avoidance: . Contrary to popular opinion, there are no "hypoallergenic" breeds of dogs or cats. That is because people are not allergic to an animal's hair, but to an allergen found in the animal's saliva, dander (dead skin flakes) or urine. Pet allergy symptoms typically occur within minutes. For some people, symptoms can build up and become most severe 8 to 12 hours after contact with the animal. People with severe allergies can experience reactions in public places if dander has been transported on the pet owners' clothing. . Marland Kitcheneeping an animal outdoors is only a partial solution, since homes with pets in the yard still have higher concentrations of animal allergens. . Before getting a pet, ask your allergist to determine if you are allergic to animals. If your pet is already considered part of your family, try to minimize contact and keep the pet out of the bedroom and other rooms where you spend a great deal of time. . As with dust mites, vacuum carpets often or replace carpet  with a hardwood floor, tile or linoleum. . High-efficiency particulate air (HEPA) cleaners can reduce allergen levels over time. . While dander and saliva are the source of cat and dog allergens, urine is the source of allergens from rabbits, hamsters, mice and [place] pigs; so ask a non-allergic family member to clean the animal's  cage. . If you have a pet allergy, talk to your allergist about the potential for allergy immunotherapy (allergy shots). This strategy can often provide long-term relief. Mold Control . Mold and fungi can grow on a variety of surfaces provided certain temperature and moisture conditions exist.  . Outdoor molds grow on plants, decaying vegetation and soil. The major outdoor mold, Alternaria and Cladosporium, are found in very high numbers during hot and dry conditions. Generally, a late summer - fall peak is seen for common outdoor fungal spores. Rain will temporarily lower outdoor mold spore count, but counts rise rapidly when the rainy period ends. . The most important indoor molds are Aspergillus and Penicillium. Dark, humid and poorly ventilated basements are ideal sites for mold growth. The next most common sites of mold growth are the bathroom and the kitchen. Outdoor (Seasonal) Mold Control . Use air conditioning and keep windows closed. . Avoid exposure to decaying vegetation. Marland Kitchen Avoid leaf raking. . Avoid grain handling. . Consider wearing a face mask if working in moldy areas.  Indoor (Perennial) Mold Control  . Maintain humidity below 50%. . Get rid of mold growth on hard surfaces with water, detergent and, if necessary, 5% bleach (do not mix with other cleaners). Then dry the area completely. If mold covers an area more than 10 square feet, consider hiring an indoor environmental professional. . For clothing, washing with soap and water is best. If moldy items cannot be cleaned and dried, throw them away. . Remove sources e.g. contaminated carpets. . Repair and seal leaking roofs or pipes. Using dehumidifiers in damp basements may be helpful, but empty the water and clean units regularly to prevent mildew from forming. All rooms, especially basements, bathrooms and kitchens, require ventilation and cleaning to deter mold and mildew growth. Avoid carpeting on concrete or damp floors, and  storing items in damp areas.

## 2020-05-15 NOTE — Assessment & Plan Note (Addendum)
Started to break out in hives on July 1st.  5 days later developed some facial and hand swelling at times.  No respiratory compromise.  Episodes of feeling like there is something in her throat and chest at times.  The hives occur on a daily basis and they wax and wane.  Completely resolved while on prednisone.  Takes Zyrtec and Benadryl as needed with some benefit.  No specific triggers identified.  15 to 20 years ago had similar occurrence lasting for 1 year.  She had skin biopsy at that time which showed hives.  That episode was possibly triggered by a lung infection.  Denies any recent changes in medications, diet, personal care products or infections.  Cholecystectomy in November 2020.  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 Will get some bloodwork to rule out any other underlying causes.  Discussed that patient's with history of hives are more likely to develop hives later on.  The angioedema episodes can be associated with hives as well.  Start taking famotidine 32m twice a day.  Increase cetirizine 175mto twice a day.  May take up to 2 tablets twice a day. This may cause drowsiness.   Monitor symptoms.  For mild symptoms you can take over the counter antihistamines such as Benadryl and monitor symptoms closely. If symptoms worsen or if you have severe symptoms including breathing issues, throat closure, significant swelling, whole body hives, severe diarrhea and vomiting, lightheadedness then seek immediate medical care. . Avoid the following potential triggers: alcohol, tight clothing, NSAIDs.  . If above regimen does not control symptoms then will discuss starting omalizumab injections next.

## 2020-05-15 NOTE — Assessment & Plan Note (Signed)
Perennial rhinitis symptoms for 35+ years.  Takes Zyrtec with good benefit.  Previous work-up showed multiple positives per patient report.  No previous allergy injections or ENT evaluation.  Continue environmental control measures.    The zyrtec as above should also help with these symptoms.   Unable to skin test today due to recent antihistamine intake and will check environmental allergy panel via bloodwork.

## 2020-05-16 ENCOUNTER — Ambulatory Visit: Payer: BC Managed Care – PPO | Admitting: Internal Medicine

## 2020-05-16 ENCOUNTER — Encounter: Payer: Self-pay | Admitting: Internal Medicine

## 2020-05-16 VITALS — BP 98/78 | HR 77 | Ht 72.0 in | Wt 209.6 lb

## 2020-05-16 DIAGNOSIS — I471 Supraventricular tachycardia: Secondary | ICD-10-CM | POA: Diagnosis not present

## 2020-05-16 DIAGNOSIS — I10 Essential (primary) hypertension: Secondary | ICD-10-CM

## 2020-05-16 MED ORDER — PROPRANOLOL HCL ER 60 MG PO CP24: 60.0000 mg | ORAL_CAPSULE | Freq: Every day | ORAL | 3 refills | Status: AC

## 2020-05-16 NOTE — Progress Notes (Signed)
HPI [name] [name] returns today for evaluation after experiencing near syncope. 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 She had gall bladder surgery and has been exercising and dieting and lost 40 lbs. She has not had recurrent symptoms of SVT. She was at church and had a syncopal spell characterized as feeling poorly while in the standing position, she tried to lie down but lost postural tone. She had nausea and diaphoresis. She did not lose continence or bite her tongue. She has been on propranolol ER for migraines and her dose was decreased to 60 mg daily. She notes that she has one episode in the past similar to this one but did not pass out all of the way.  Allergies  Allergen Reactions  . Other Rash    Other reaction(s): Fever, Other (See Comments) Other reaction(s): Fever  Other reaction(s): Fever (intolerance) Swelling and itching  . Sulfa Antibiotics Other (See Comments)    Other reaction(s): Fever  . Tape Itching and Swelling    Redness ,swelling and itching Redness ,swelling and itching     Current Outpatient Medications  Medication Sig Dispense Refill  . aspirin 81 MG tablet Take 81 mg by mouth every evening.     . cetirizine (ZYRTEC) 10 MG tablet Take 10 mg by mouth 2 (two) times daily.     . famotidine (PEPCID) 20 MG tablet Take 1 tablet (20 mg total) by mouth 2 (two) times daily. 60 tablet 5  . levonorgestrel-ethinyl estradiol (SEASONALE,INTROVALE,JOLESSA) 0.15-0.03 MG tablet Take 1 tablet by mouth every evening.     . propranolol ER (INDERAL LA) 60 MG 24 hr capsule Take 1 capsule (60 mg total) by mouth daily. 90 capsule 3  . SUMAtriptan (IMITREX) 50 MG tablet Take 1 tablet by mouth every 2 (two) hours as needed for migraine. Take as directed for migraines     No current facility-administered medications for this visit.     Past Medical History:  Diagnosis Date  . Allergic   . Angio-edema   . Chest pain   . Cholecystitis, acute with  cholelithiasis 08/18/2019  . Endometriosis   . Hepatic focal nodular hyperplasia   . History of kidney stones   . History of supraventricular tachycardia 05-09-2019  pt denies any cardiac S&S including no palpitations   03-11-2017   s/p  ablation by dr g. Delani Kohli;  primary cardiologist dr Claiborne Billings,  pt released on as needed basis  . Hypertension   . Leukocytosis   . Migraines   . Mitral valve prolapse   . Other allergic rhinitis 05/15/2020  . Paroxysmal SVT (supraventricular tachycardia) ([place])   . Seasonal allergies   . Ureteropelvic junction calculus    left side  . Urticaria     ROS:   All systems reviewed and negative except as noted in the HPI.   Past Surgical History:  Procedure Laterality Date  . CHOLECYSTECTOMY N/A 08/19/2019   Procedure: LAPAROSCOPIC CHOLECYSTECTOMY WITH INTRAOPERATIVE CHOLANGIOGRAM;  Surgeon: Armandina Gemma, MD;  Location: [facility];  Service: General;  Laterality: N/A;  . EXCISION OF BREAST BIOPSY Right 07-21-2000   dr Lucia Gaskins @[facility]   . HOLMIUM LASER APPLICATION  9/45/8592   Procedure: HOLMIUM LASER APPLICATION;  Surgeon: Kathie Rhodes, MD;  Location: [facility];  Service: Urology;;  . PELVIC LAPAROSCOPY  1990s   endometriosis  . PLANTAR FASCIA SURGERY Left 2018  . SVT ABLATION N/A 03/11/2017   Procedure: SVT Ablation;  Surgeon: Evans Lance, MD;  Location: [facility];  Service: Cardiovascular;  Laterality: N/A;  . URETEROSCOPY WITH HOLMIUM LASER LITHOTRIPSY Left 05/11/2019   Procedure: URETEROSCOPY,RETROGRADE WITH HOLMIUM LASER LITHOTRIPSY, STENT PLACEMENT;  Surgeon: Kathie Rhodes, MD;  Location: [facility];  Service: Urology;  Laterality: Left;  . WRIST SURGERY Left 2012   cyst removal     Family History  Problem Relation Age of Onset  . Diabetes Mother        did of complications of C-Diff  . Parkinson's disease Father      Social History   Socioeconomic History  . Marital status: Single    Spouse name: Not on  file  . Number of children: Not on file  . Years of education: Not on file  . Highest education level: Not on file  Occupational History  . Not on file  Tobacco Use  . Smoking status: Never Smoker  . Smokeless tobacco: Never Used  Vaping Use  . Vaping Use: Never used  Substance and Sexual Activity  . Alcohol use: Yes    Comment: occasional  . Drug use: Never  . Sexual activity: Not on file  Other Topics Concern  . Not on file  Social History Narrative  . Not on file   Social Determinants of Health   Financial Resource Strain:   . Difficulty of Paying Living Expenses:   Food Insecurity:   . Worried About Charity fundraiser in the Last Year:   . Arboriculturist in the Last Year:   Transportation Needs:   . Film/video editor (Medical):   Marland Kitchen Lack of Transportation (Non-Medical):   Physical Activity:   . Days of Exercise per Week:   . Minutes of Exercise per Session:   Stress:   . Feeling of Stress :   Social Connections:   . Frequency of Communication with Friends and Family:   . Frequency of Social Gatherings with Friends and Family:   . Attends Religious Services:   . Active Member of Clubs or Organizations:   . Attends Archivist Meetings:   Marland Kitchen Marital Status:   Intimate Partner Violence:   . Fear of Current or Ex-Partner:   . Emotionally Abused:   Marland Kitchen Physically Abused:   . Sexually Abused:      BP 98/78   Pulse 77   Ht 6' (1.829 m)   Wt 209 lb 9.6 oz (95.1 kg)   SpO2 98%   BMI 28.43 kg/m   Physical Exam:  Well appearing NAD HEENT: Unremarkable Neck:  No JVD, no thyromegally Lymphatics:  No adenopathy Back:  No CVA tenderness Lungs:  Clear with no wheezes HEART:  Regular rate rhythm, no murmurs, no rubs, no clicks Abd:  soft, positive bowel sounds, no organomegally, no rebound, no guarding Ext:  2 plus pulses, no edema, no cyanosis, no clubbing Skin:  No rashes no nodules Neuro:  CN II through XII intact, motor grossly intact  EKG -  nsr  Assess/Plan: 1. 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 I discussed the mechanism and the importance of hydration, salt intake, avoidance of caffeine and ETOH and encouraged exercise. She is on propranolol which could be making this worse, or could be beneficial. If her spells continue we might recommend stopping the propranolol and referral back to a neurologist.  2. SVT - she is s/p ablation and has no evidence of recurrent SVT.  3.  Weight loss - she has lost over 40 lbs. I encouraged her to continue but recommended she try and increase her salt intake.   Mikle Bosworth.D.

## 2020-05-16 NOTE — Patient Instructions (Addendum)
Medication Instructions:  Your physician recommends that you continue on your current medications as directed. Please refer to the Current Medication list given to you today.  Increase salt  Increase fluid  Labwork: None ordered.  Testing/Procedures: None ordered.  Follow-Up: Your physician wants you to follow-up in: as needed with Dr. Lovena Le.      Any Other Special Instructions Will Be Listed Below (If Applicable).  If you need a refill on your cardiac medications before your next appointment, please call your pharmacy.

## 2020-05-20 ENCOUNTER — Ambulatory Visit: Payer: BC Managed Care – PPO | Admitting: Physician Assistant

## 2020-05-22 LAB — CBC WITH DIFFERENTIAL/PLATELET
Basophils Absolute: 0 10*3/uL (ref 0.0–0.2)
Basos: 0 %
EOS (ABSOLUTE): 0.1 10*3/uL (ref 0.0–0.4)
Eos: 1 %
Hematocrit: 46.5 % (ref 34.0–46.6)
Hemoglobin: 15.3 g/dL (ref 11.1–15.9)
Immature Grans (Abs): 0.1 10*3/uL (ref 0.0–0.1)
Immature Granulocytes: 1 %
Lymphocytes Absolute: 2 10*3/uL (ref 0.7–3.1)
Lymphs: 23 %
MCH: 29.7 pg (ref 26.6–33.0)
MCHC: 32.9 g/dL (ref 31.5–35.7)
MCV: 90 fL (ref 79–97)
Monocytes Absolute: 0.3 10*3/uL (ref 0.1–0.9)
Monocytes: 3 %
Neutrophils Absolute: 6.3 10*3/uL (ref 1.4–7.0)
Neutrophils: 72 %
Platelets: 347 10*3/uL (ref 150–450)
RBC: 5.16 x10E6/uL (ref 3.77–5.28)
RDW: 12.5 % (ref 11.7–15.4)
WBC: 8.7 10*3/uL (ref 3.4–10.8)

## 2020-05-22 LAB — ALLERGENS W/TOTAL IGE AREA 2
Alternaria Alternata IgE: <0.1 kU/L
Aspergillus Fumigatus IgE: <0.1 kU/L
Bermuda Grass IgE: <0.1 kU/L
Cat Dander IgE: <0.1 kU/L
Cedar, Mountain IgE: <0.1 kU/L
Cladosporium Herbarum IgE: <0.1 kU/L
Cockroach, German IgE: <0.1 kU/L
Common Silver Birch IgE: <0.1 kU/L
Cottonwood IgE: <0.1 kU/L
D Farinae IgE: <0.1 kU/L
D Pteronyssinus IgE: <0.1 kU/L
Dog Dander IgE: <0.1 kU/L
Elm, American IgE: <0.1 kU/L
IgE (Immunoglobulin E), Serum: <2 IU/mL — ABNORMAL LOW (ref 6–495)
Johnson Grass IgE: <0.1 kU/L
Maple/Box Elder IgE: <0.1 kU/L
Mouse Urine IgE: <0.1 kU/L
Oak, White IgE: <0.1 kU/L
Pecan, Hickory IgE: <0.1 kU/L
Penicillium Chrysogen IgE: <0.1 kU/L
Pigweed, Rough IgE: <0.1 kU/L
Ragweed, Short IgE: <0.1 kU/L
Sheep Sorrel IgE Qn: <0.1 kU/L
Timothy Grass IgE: <0.1 kU/L
White Mulberry IgE: <0.1 kU/L

## 2020-05-22 LAB — ALPHA-GAL PANEL
Alpha Gal IgE*: <0.1 kU/L (ref <0.10)
Beef (Bos spp) IgE: <0.1 kU/L (ref <0.35)
Class Interpretation: 0
Class Interpretation: 0
Class Interpretation: 0
Lamb/Mutton (Ovis spp) IgE: <0.1 kU/L (ref <0.35)
Pork (Sus spp) IgE: <0.1 kU/L (ref <0.35)

## 2020-05-22 LAB — C1 ESTERASE INHIBITOR: C1INH SerPl-mCnc: 32 mg/dL (ref 21–39)

## 2020-05-22 LAB — COMPREHENSIVE METABOLIC PANEL
ALT: 19 IU/L (ref 0–32)
AST: 19 IU/L (ref 0–40)
Albumin/Globulin Ratio: 1.9 (ref 1.2–2.2)
Albumin: 4.1 g/dL (ref 3.8–4.8)
Alkaline Phosphatase: 52 IU/L (ref 48–121)
BUN/Creatinine Ratio: 24 — ABNORMAL HIGH (ref 9–23)
BUN: 20 mg/dL (ref 6–24)
Bilirubin Total: 0.4 mg/dL (ref 0.0–1.2)
CO2: 23 mmol/L (ref 20–29)
Calcium: 9.4 mg/dL (ref 8.7–10.2)
Chloride: 102 mmol/L (ref 96–106)
Creatinine, Ser: 0.85 mg/dL (ref 0.57–1.00)
GFR calc Af Amer: 94 mL/min/{1.73_m2} (ref >59)
GFR calc non Af Amer: 82 mL/min/{1.73_m2} (ref >59)
Globulin, Total: 2.2 g/dL (ref 1.5–4.5)
Glucose: 135 mg/dL — ABNORMAL HIGH (ref 65–99)
Potassium: 4.9 mmol/L (ref 3.5–5.2)
Sodium: 141 mmol/L (ref 134–144)
Total Protein: 6.3 g/dL (ref 6.0–8.5)

## 2020-05-22 LAB — C3 AND C4
Complement C3, Serum: 140 mg/dL (ref 82–167)
Complement C4, Serum: 30 mg/dL (ref 12–38)

## 2020-05-22 LAB — THYROID CASCADE PROFILE: TSH: 1.54 u[IU]/mL (ref 0.450–4.500)

## 2020-05-22 LAB — C1 ESTERASE INHIBITOR, FUNCTIONAL: C1INH Functional/C1INH Total MFr SerPl: >91 %mean normal

## 2020-05-22 LAB — CHRONIC URTICARIA: cu index: 43.2 — ABNORMAL HIGH (ref <10)

## 2020-05-22 LAB — C-REACTIVE PROTEIN: CRP: 20 mg/L — ABNORMAL HIGH (ref 0–10)

## 2020-05-22 LAB — ANA W/REFLEX: Anti Nuclear Antibody (ANA): NEGATIVE

## 2020-05-22 LAB — TRYPTASE: Tryptase: 13.1 ug/L (ref 2.2–13.2)

## 2020-06-11 DIAGNOSIS — J310 Chronic rhinitis: Secondary | ICD-10-CM | POA: Insufficient documentation

## 2020-06-11 NOTE — Progress Notes (Signed)
Follow Up Note  RE: [name] [name] MRN: [id number] DOB: [birth year] Date of Office Visit: 06/12/2020  Referring provider: Joycelyn Rua, MD Primary care provider: Joycelyn Rua, MD  Chief Complaint: Urticaria (hives went away 2 weeks ago, now is having pin pricks in the evening befor bed)  History of Present Illness: I had the pleasure of seeing [name] [name] for a follow up visit at the [facility] on 06/12/2020. She is a 49 y.o. female, who is being followed for urticaria/angioedema, rhinitis. Her previous allergy office visit was on 05/15/2020 with Dr. Selena Batten. Today is a regular follow up visit.  Urticaria The large hives resolved 2 weeks ago.  Still has some episodes of lip and eye swelling. In the evenings having some mild pin point rashes still which resolve by the next day.   Currently on Zyrtec 10mg  twice a day and Pepcid 20mg  twice a day. Zyrtec causes some drowsiness and sometimes forgets to take the morning dose. She noticed that if she misses the morning Pepcid then she gets some throat discomfort by the evening.   Rhinitis: Well-controlled.  Usually flares in the spring and fall. Takes elderberry during these times with good benefit.  Assessment and Plan: [name] is a 49 y.o. female with: Urticaria Past history - Started to break out in hives on July 1st.  5 days later developed some facial and hand swelling at times.  No respiratory compromise.  Episodes of feeling like there is something in her throat and chest at times.  The hives occur on a daily basis and they wax and wane.  Completely resolved while on prednisone.  Takes Zyrtec and Benadryl as needed with some benefit.  No specific triggers identified.  15 to 20 years ago had similar occurrence lasting for 1 year.  She had skin biopsy at that time which showed hives.  That episode was possibly triggered by a lung infection.  Denies any recent changes in medications, diet, personal care products or  infections.  Cholecystectomy in November 2020. Interim history - bloodwork only positive to CU index. Doing better but still having some pin point rash and mild lip/eye angioedema.   Continue famotidine 20mg  twice a day.  Continue cetirizine 10mg  twice a day.  May take up to 2 tablets twice a day during flares.  Monitor symptoms.  If you have another bad flare, take pictures and let us know.   For mild symptoms you can take over the counter antihistamines such as Benadryl and monitor symptoms closely. If symptoms worsen or if you have severe symptoms including breathing issues, throat closure, significant swelling, whole body hives, severe diarrhea and vomiting, lightheadedness then seek immediate medical care. . Avoid the following potential triggers: alcohol, tight clothing, NSAIDs.  . If above regimen does not control symptoms then will discuss starting omalizumab injections next.   Angio-edema  See assessment and plan as above.  Nonallergic rhinitis Past history - Perennial rhinitis symptoms for 35+ years.  Takes Zyrtec with good benefit.  Previous work-up showed multiple positives per patient report.  No previous allergy injections or ENT evaluation. Interim history - negative environmental allergy panel via bloodwork.  Monitor symptoms.   Return in about 2 months (around 08/12/2020).  Diagnostics: None.  Medication List:  Current Outpatient Medications  Medication Sig Dispense Refill  . aspirin 81 MG tablet Take 81 mg by mouth every evening.     . cetirizine (ZYRTEC) 10 MG tablet Take 10 mg by mouth 2 (  two) times daily.     . famotidine (PEPCID) 20 MG tablet Take 1 tablet (20 mg total) by mouth 2 (two) times daily. 60 tablet 5  . levonorgestrel-ethinyl estradiol (SEASONALE,INTROVALE,JOLESSA) 0.15-0.03 MG tablet Take 1 tablet by mouth every evening.     . propranolol ER (INDERAL LA) 60 MG 24 hr capsule Take 1 capsule (60 mg total) by mouth daily. 90 capsule 3  .  SUMAtriptan (IMITREX) 50 MG tablet Take 1 tablet by mouth every 2 (two) hours as needed for migraine. Take as directed for migraines     No current facility-administered medications for this visit.   Allergies: Allergies  Allergen Reactions  . Sulfa Antibiotics Other (See Comments)    Other reaction(s): Fever  . Tape Itching and Swelling    Redness ,swelling and itching    I reviewed her past medical history, social history, family history, and environmental history and no significant changes have been reported from her previous visit.  Review of Systems  Constitutional: Negative for appetite change, chills, fever and unexpected weight change.  HENT: Negative for congestion and rhinorrhea.   Eyes: Negative for itching.  Respiratory: Negative for cough, chest tightness, shortness of breath and wheezing.   Cardiovascular: Negative for chest pain.  Gastrointestinal: Negative for abdominal pain.  Genitourinary: Negative for difficulty urinating.  Skin: Positive for rash.  Neurological: Negative for headaches.   Objective: BP 110/70   Pulse 79   Temp 98.1 F (36.7 C) (Oral)   Resp 16   SpO2 99%  There is no height or weight on file to calculate BMI. Physical Exam Vitals and nursing note reviewed.  Constitutional:      Appearance: Normal appearance. She is well-developed.  HENT:     Head: Normocephalic and atraumatic.     Right Ear: Tympanic membrane and external ear normal.     Left Ear: Tympanic membrane and external ear normal.     Nose: Nose normal.     Mouth/Throat:     Mouth: Mucous membranes are moist.     Pharynx: Oropharynx is clear.  Eyes:     Conjunctiva/sclera: Conjunctivae normal.  Cardiovascular:     Rate and Rhythm: Normal rate and regular rhythm.     Heart sounds: Normal heart sounds. No murmur heard.  No friction rub. No gallop.   Pulmonary:     Effort: Pulmonary effort is normal.     Breath sounds: Normal breath sounds. No wheezing, rhonchi or rales.   Musculoskeletal:     Cervical back: Neck supple.  Skin:    General: Skin is warm.     Findings: No rash.  Neurological:     Mental Status: She is alert and oriented to person, place, and time.  Psychiatric:        Mood and Affect: Mood normal.        Behavior: Behavior normal.    Previous notes and tests were reviewed. The plan was reviewed with the patient/family, and all questions/concerned were addressed.  It was my pleasure to see Saria today and participate in her care. Please feel free to contact me with any questions or concerns.  Sincerely,  Wyline Mood, DO Allergy & Immunology  [facility] office: [phone] [facility] office: [phone] [place] office: [phone]

## 2020-06-12 ENCOUNTER — Ambulatory Visit: Payer: BC Managed Care – PPO | Admitting: Allergy

## 2020-06-12 ENCOUNTER — Encounter: Payer: Self-pay | Admitting: Allergy

## 2020-06-12 ENCOUNTER — Other Ambulatory Visit: Payer: Self-pay

## 2020-06-12 VITALS — BP 110/70 | HR 79 | Temp 98.1°F | Resp 16

## 2020-06-12 DIAGNOSIS — L509 Urticaria, unspecified: Secondary | ICD-10-CM | POA: Diagnosis not present

## 2020-06-12 DIAGNOSIS — J310 Chronic rhinitis: Secondary | ICD-10-CM | POA: Diagnosis not present

## 2020-06-12 DIAGNOSIS — T783XXD Angioneurotic edema, subsequent encounter: Secondary | ICD-10-CM | POA: Diagnosis not present

## 2020-06-12 NOTE — Assessment & Plan Note (Signed)
.   See assessment and plan as above. 

## 2020-06-12 NOTE — Assessment & Plan Note (Signed)
Past history - Perennial rhinitis symptoms for 35+ years.  Takes Zyrtec with good benefit.  Previous work-up showed multiple positives per patient report.  No previous allergy injections or ENT evaluation. Interim history - negative environmental allergy panel via bloodwork.  Monitor symptoms.

## 2020-06-12 NOTE — Assessment & Plan Note (Signed)
Past history - Started to break out in hives on July 1st.  5 days later developed some facial and hand swelling at times.  No respiratory compromise.  Episodes of feeling like there is something in her throat and chest at times.  The hives occur on a daily basis and they wax and wane.  Completely resolved while on prednisone.  Takes Zyrtec and Benadryl as needed with some benefit.  No specific triggers identified.  15 to 20 years ago had similar occurrence lasting for 1 year.  She had skin biopsy at that time which showed hives.  That episode was possibly triggered by a lung infection.  Denies any recent changes in medications, diet, personal care products or infections.  Cholecystectomy in November 2020. Interim history - bloodwork only positive to CU index. Doing better but still having some pin point rash and mild lip/eye angioedema.   Continue famotidine 45m twice a day.  Continue cetirizine 138mtwice a day.  May take up to 2 tablets twice a day during flares.  Monitor symptoms.  If you have another bad flare, take pictures and let usKoreanow.   For mild symptoms you can take over the counter antihistamines such as Benadryl and monitor symptoms closely. If symptoms worsen or if you have severe symptoms including breathing issues, throat closure, significant swelling, whole body hives, severe diarrhea and vomiting, lightheadedness then seek immediate medical care. . Avoid the following potential triggers: alcohol, tight clothing, NSAIDs.  . If above regimen does not control symptoms then will discuss starting omalizumab injections next.

## 2020-06-12 NOTE — Patient Instructions (Addendum)
Hives:  Continue famotidine 35m twice a day.  Continue cetirizine 115mtwice a day.  May take up to 2 tablets twice a day during flares.  Monitor symptoms.  If you have another bad flare, take pictures and let usKoreanow.    For mild symptoms you can take over the counter antihistamines such as Benadryl and monitor symptoms closely. If symptoms worsen or if you have severe symptoms including breathing issues, throat closure, significant swelling, whole body hives, severe diarrhea and vomiting, lightheadedness then seek immediate medical care.  . Avoid the following potential triggers: alcohol, tight clothing, NSAIDs.   Non-allergic rhinitis:  Negative bloodwork for environmental allergies.  Monitor symptoms.  Follow up in 2 months or sooner if needed.

## 2020-08-21 ENCOUNTER — Ambulatory Visit: Payer: BC Managed Care – PPO | Admitting: Allergy

## 2021-08-01 LAB — COLOGUARD: COLOGUARD: NEGATIVE

## 2021-08-01 LAB — EXTERNAL GENERIC LAB PROCEDURE: COLOGUARD: NEGATIVE

## 2022-01-26 ENCOUNTER — Other Ambulatory Visit: Payer: Self-pay | Admitting: Urology

## 2022-03-22 ENCOUNTER — Encounter (HOSPITAL_BASED_OUTPATIENT_CLINIC_OR_DEPARTMENT_OTHER): Payer: Self-pay | Admitting: Urology

## 2022-03-22 NOTE — Progress Notes (Signed)
Patient pre-op phone call complete. Arrival time and procedure date verified. Patient allergies, past medical history, and medications reviewed. Patient able to have clear liquids until 1000 and solids until 0800. Patient advised to have laxative of choice the day prior to procedure. Patient advised not to take any Aspirin. Last dose was on 03/20/2022 per patient. Patient instructed not to have Pepto Bismol or NSAIDS 48 hours prior to procedure. Patient denies any recent history of chest pain or COVID. Driver secured.

## 2022-03-25 ENCOUNTER — Encounter (HOSPITAL_BASED_OUTPATIENT_CLINIC_OR_DEPARTMENT_OTHER): Payer: Self-pay | Admitting: Urology

## 2022-03-25 ENCOUNTER — Ambulatory Visit (HOSPITAL_BASED_OUTPATIENT_CLINIC_OR_DEPARTMENT_OTHER)
Admission: RE | Admit: 2022-03-25 | Discharge: 2022-03-25 | Disposition: A | Discharge status: Home / Self Care | Payer: BC Managed Care – PPO | Attending: Urology | Admitting: Urology

## 2022-03-25 ENCOUNTER — Encounter (HOSPITAL_BASED_OUTPATIENT_CLINIC_OR_DEPARTMENT_OTHER)
Admission: RE | Disposition: A | Discharge status: Home / Self Care | Payer: Self-pay | Source: Home / Self Care | Attending: Urology

## 2022-03-25 ENCOUNTER — Ambulatory Visit (HOSPITAL_COMMUNITY): Payer: BC Managed Care – PPO

## 2022-03-25 DIAGNOSIS — I471 Supraventricular tachycardia: Secondary | ICD-10-CM | POA: Insufficient documentation

## 2022-03-25 DIAGNOSIS — I10 Essential (primary) hypertension: Secondary | ICD-10-CM | POA: Insufficient documentation

## 2022-03-25 DIAGNOSIS — N200 Calculus of kidney: Secondary | ICD-10-CM | POA: Diagnosis present

## 2022-03-25 DIAGNOSIS — Z01818 Encounter for other preprocedural examination: Secondary | ICD-10-CM

## 2022-03-25 HISTORY — PX: EXTRACORPOREAL SHOCK WAVE LITHOTRIPSY: SHX1557

## 2022-03-25 LAB — POCT PREGNANCY, URINE: Preg Test, Ur: NEGATIVE

## 2022-03-25 SURGERY — LITHOTRIPSY, ESWL
Anesthesia: LOCAL | Laterality: Left

## 2022-03-25 MED ORDER — DIAZEPAM 5 MG PO TABS
ORAL_TABLET | ORAL | Status: AC
  Filled 2022-03-25: qty 2

## 2022-03-25 MED ORDER — SODIUM CHLORIDE 0.9 % IV SOLN: INTRAVENOUS | Status: DC

## 2022-03-25 MED ORDER — CIPROFLOXACIN HCL 500 MG PO TABS
500.0000 mg | ORAL_TABLET | ORAL | Status: AC
  Administered 2022-03-25: 500 mg via ORAL

## 2022-03-25 MED ORDER — CIPROFLOXACIN HCL 500 MG PO TABS
ORAL_TABLET | ORAL | Status: AC
  Filled 2022-03-25: qty 1

## 2022-03-25 MED ORDER — DIAZEPAM 5 MG PO TABS
10.0000 mg | ORAL_TABLET | ORAL | Status: AC
  Administered 2022-03-25: 10 mg via ORAL

## 2022-03-25 MED ORDER — DIPHENHYDRAMINE HCL 25 MG PO CAPS
ORAL_CAPSULE | ORAL | Status: AC
  Filled 2022-03-25: qty 1

## 2022-03-25 MED ORDER — TAMSULOSIN HCL 0.4 MG PO CAPS: 0.4000 mg | ORAL_CAPSULE | Freq: Every day | ORAL | 0 refills | Status: DC

## 2022-03-25 MED ORDER — TRAMADOL HCL 50 MG PO TABS: 50.0000 mg | ORAL_TABLET | Freq: Four times a day (QID) | ORAL | 0 refills | Status: DC | PRN

## 2022-03-25 MED ORDER — DIPHENHYDRAMINE HCL 25 MG PO CAPS
25.0000 mg | ORAL_CAPSULE | ORAL | Status: AC
  Administered 2022-03-25: 25 mg via ORAL

## 2022-03-25 NOTE — Discharge Instructions (Signed)
See [facility] discharge instructions in chart.

## 2022-03-25 NOTE — Interval H&P Note (Signed)
History and Physical Interval Note:  03/25/2022 1:56 PM  [name] [name]  has presented today for surgery, with the diagnosis of LEFT RENAL STONE.  The various methods of treatment have been discussed with the patient and family. After consideration of risks, benefits and other options for treatment, the patient has consented to  Procedure(s): LEFT EXTRACORPOREAL SHOCK WAVE LITHOTRIPSY (ESWL) (Left) as a surgical intervention.  The patient's history has been reviewed, patient examined, no change in status, stable for surgery.  I have reviewed the patient's chart and labs.  Questions were answered to the patient's satisfaction.     Ardis Hughs

## 2022-03-25 NOTE — Op Note (Signed)
See [facility] OP note scanned into chart. Also because of the size, density, location and other factors that cannot be anticipated I feel this will likely be a staged procedure. This fact supersedes any indication in the scanned [facility] operative note to the contrary.  

## 2022-03-25 NOTE — Interval H&P Note (Signed)
History and Physical Interval Note:  03/25/2022 1:57 PM  [name] [name]  has presented today for surgery, with the diagnosis of LEFT RENAL STONE.  The various methods of treatment have been discussed with the patient and family. After consideration of risks, benefits and other options for treatment, the patient has consented to  Procedure(s): LEFT EXTRACORPOREAL SHOCK WAVE LITHOTRIPSY (ESWL) (Left) as a surgical intervention.  The patient's history has been reviewed, patient examined, no change in status, stable for surgery.  I have reviewed the patient's chart and labs.  Questions were answered to the patient's satisfaction.     Ardis Hughs

## 2022-03-25 NOTE — H&P (Signed)
I have kidney stones.  HPI: [name] [name] is a 51 year-old female established patient who is here for renal calculi.  01/07/20: She returns today for 6 month follow-up having undergone ureteroscopy for a large left renal calculus. She has been doing well. She has not had any flank pain or hematuria. She is asymptomatic today.   05/05/2021: 51 year old female who presents today for 1 year follow-up. She reports she has done very well over the past urine denies any interval stone passage, flank pain, gross hematuria, urinary tract infections. She is currently on continuous birth control due to breakthrough bleeding and reports that her urine always has microscopic hematuria. She underwent a negative microscopic hematuria evaluation. Her stone makeup is primarily calcium oxalate. She is not currently on any medications for management of this. She is managing it with diet.   08/19/21: 51 year old female who presents today for her 48-monthfollow-up regarding left-sided urolithiasis. Denies interval stone passage or discomfort. She is currently managing stone prevention with diet and increased water.   12/30/2021: Ms. BKingdonis a 51year old female who presents today for follow-up regarding left-sided nephrolithiasis. She currently has some microscopic hematuria however this is not uncommon for her. She denies any changes to her voiding habits. He denies flank pain and gross hematuria. 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 She has had no passage of stone material in the interval.   02/25/2022: 51year old female who is scheduled to undergo lithotripsy on a 6 mm left renal stone presents today for preoperative appointment. She has had no complaints of pain or discomfort. She denies fevers and chills. She denies gross hematuria. She denies changes to her medications, allergies, medical history.   The problem is on the left side.  She first began experiencing symptoms 08/19/2018. This is not her first kidney stone. Her first stone was 09/18/2006. She has had 1 stones prior to getting this one. She is currently having flank pain. She denies having back pain, groin pain, nausea, vomiting, fever, and chills. She has not caught a stone in her urine strainer since her symptoms began.   She has never had surgical treatment for calculi in the past.     ALLERGIES: sulfa - fever    MEDICATIONS: Aspirin  Birth Control  Propranolol Hcl 60 mg tablet     GU PSH: Ureteroscopic laser litho, Left - 2020 Ureteroscopic stone removal, Left - 2020     NON-GU PSH: Foot surgery (unspecified), Left Wrist Arthroscopy/surgery, Left     GU PMH: Renal calculus - 12/30/2021, - 08/19/2021, - 05/05/2021, - 2020, - 2020, Left, She is going to be scheduled for a CT scan to evaluate her left renal calculus and her right-sided flank pain., - 2020 History of urolithiasis - 08/19/2021, She has a history of calculus disease but has no evidence of recurrent stones. She will continue with her current diet and fluid intake regimen and return again in 1 year for KUB. 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 This appears to have been effective at helping prevent further stone formation and will continue this. - 2021      PMH Notes: Microscopic hematuria: She was evaluated for this by Dr. NJanice Norriein 6/06 with a renal ultrasound which revealed left renal cyst but no other abnormality and negative cystoscopy at as well as an MP 22.   Left renal cyst: These  were noted on ultrasound in 6/06.   Calculus disease: She passed a stone in 8/0 6.  Stone analysis: 90% calcium oxalate and 10% calcium phosphate.  Metabolic workup: Serum studies - normal  24 hour urine - hypocitraturia and hyperuricosuria   NON-GU PMH: No Non-GU PMH    FAMILY HISTORY: No Family History    SOCIAL HISTORY: Marital Status:  Single Preferred Language: English; Ethnicity: Not Hispanic Or Latino; Race: White Current Smoking Status: Patient has never smoked.   Tobacco Use Assessment Completed: Used Tobacco in last 30 days? Does drink.  Does not drink caffeine.    REVIEW OF SYSTEMS:    GU Review Female:   Patient denies frequent urination, hard to postpone urination, burning /pain with urination, get up at night to urinate, leakage of urine, stream starts and stops, trouble starting your stream, have to strain to urinate, and being pregnant.  Gastrointestinal (Upper):   Patient denies nausea, vomiting, and indigestion/ heartburn.  Gastrointestinal (Lower):   Patient denies diarrhea and constipation.  Constitutional:   Patient denies fever, night sweats, weight loss, and fatigue.  Skin:   Patient denies skin rash/ lesion and itching.  Musculoskeletal:   Patient denies back pain and joint pain.  Neurological:   Patient denies headaches and dizziness.  Psychologic:   Patient denies depression and anxiety.   VITAL SIGNS:      02/25/2022 04:17 PM  BP 124/83 mmHg  Pulse 93 /min  Temperature 98.2 F / 36.7 C   MULTI-SYSTEM PHYSICAL EXAMINATION:    Constitutional: Well-nourished. No physical deformities. Normally developed. Good grooming.  Respiratory: No labored breathing, no use of accessory muscles.   Cardiovascular: Normal temperature, normal extremity pulses, no swelling, no varicosities.  Neurologic / Psychiatric: Oriented to time, oriented to place, oriented to person. No depression, no anxiety, no agitation.  Gastrointestinal: No mass, no tenderness, no rigidity, non obese abdomen.     Complexity of Data:  Source Of History:  Patient  Records Review:   Previous Doctor Records, Previous Patient Records  Urine Test Review:   Urinalysis  X-Ray Review: KUB: Reviewed Films. Reviewed Report. Discussed With Patient.     PROCEDURES:         KUB - [id number]  A single view of the abdomen is obtained. There is a  prominent overlying bowel gas pattern. Bilateral renal shadows are visualized. Within the left renal shadow there is a 6 mm opacity noted in the mid pole. There is a 4 mm opacity noted in the left lower pole. There is potentially a small 2 mm opacity noted within the right upper pole. There are no appreciable opacities along either ureter.      . Patient confirmed No Neulasta OnPro Device.           Urinalysis w/Scope - 81001 Dipstick Dipstick Cont'd Micro  Color: Yellow Bilirubin: Neg WBC/hpf: 10 - 20/hpf  Appearance: Slightly Cloudy Ketones: Neg RBC/hpf: 10 - 20/hpf  Specific Gravity: 1.025 Blood: 3+ Bacteria: Mod (26-50/hpf)  pH: 6.0 Protein: Trace Cystals: NS (Not Seen)  Glucose: Neg Urobilinogen: 0.2 Casts: NS (Not Seen)    Nitrites: Neg Trichomonas: Not Present    Leukocyte Esterase: 2+ Mucous: Present      Epithelial Cells: 6 - 10/hpf      Yeast: NS (Not Seen)      Sperm: Not Present    Notes:      ASSESSMENT:      ICD-10 Details  1 GU:   Renal calculus - N20.0  Left, Chronic, Stable   PLAN:           Document Letter(s):  Created for Patient: Clinical Summary         Notes:   KUB shows stable stone burden in her left kidney. She is scheduled for lithotripsy and will keep this appointment. She understands to notify the clinic with any changes to her health history.

## 2022-03-26 ENCOUNTER — Encounter (HOSPITAL_BASED_OUTPATIENT_CLINIC_OR_DEPARTMENT_OTHER): Payer: Self-pay | Admitting: Urology

## 2022-11-19 ENCOUNTER — Institutional Professional Consult (permissible substitution): Payer: BC Managed Care – PPO | Admitting: Internal Medicine

## 2022-11-26 ENCOUNTER — Ambulatory Visit: Payer: BC Managed Care – PPO | Admitting: Pulmonary Disease

## 2022-11-26 ENCOUNTER — Encounter: Payer: Self-pay | Admitting: Pulmonary Disease

## 2022-11-26 VITALS — BP 132/82 | HR 99 | Ht 71.5 in | Wt 243.9 lb

## 2022-11-26 DIAGNOSIS — R053 Chronic cough: Secondary | ICD-10-CM | POA: Diagnosis not present

## 2022-11-26 MED ORDER — FLUTICASONE FUROATE-VILANTEROL 100-25 MCG/ACT IN AEPB
1.0000 | INHALATION_SPRAY | Freq: Every day | RESPIRATORY_TRACT | 1 refills | Status: DC
Start: 2022-11-26 — End: 2023-01-31

## 2022-11-26 MED ORDER — DOXYCYCLINE HYCLATE 100 MG PO TABS: 100.0000 mg | ORAL_TABLET | Freq: Two times a day (BID) | ORAL | 0 refills | Status: DC

## 2022-11-26 NOTE — Patient Instructions (Signed)
I will see you in about 4 weeks  Prescription for Breo-inhaler sent to pharmacy for you -It is once a day regardless of how you are feeling -Make sure you rinse your mouth after each use  Prescription for doxycycline to be used for 10 days  Call with significant concerns  Try Delsym for the coughing

## 2022-11-26 NOTE — Progress Notes (Signed)
[name] [name]    [id number]    [birth year]  Primary Care Physician:Meyers, Jeannett Senior, MD  Referring Physician: Joycelyn Rua, MD 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  Nonallergic rhinitis   Outpatient Encounter Medications as of 11/26/2022  Medication Sig   aspirin 81 MG tablet Take 81 mg by mouth every evening.    cetirizine (ZYRTEC) 10 MG tablet Take 10 mg by mouth 2 (two) times daily.    doxycycline (VIBRA-TABS) 100 MG tablet Take 1 tablet (100 mg total) by mouth 2 (two) times daily.   fluticasone furoate-vilanterol (BREO ELLIPTA) 100-25 MCG/ACT AEPB Inhale 1 puff into the lungs daily.   levonorgestrel-ethinyl estradiol (SEASONALE,INTROVALE,JOLESSA) 0.15-0.03 MG tablet Take 1 tablet by mouth every evening.    propranolol ER (INDERAL LA) 60 MG 24 hr capsule Take 1 capsule (60 mg total) by mouth daily.   SUMAtriptan (IMITREX) 50 MG tablet Take 1 tablet by mouth every 2 (two) hours as needed for migraine. Take as  directed for migraines   [DISCONTINUED] famotidine (PEPCID) 20 MG tablet Take 1 tablet (20 mg total) by mouth 2 (two) times daily.   [DISCONTINUED] tamsulosin (FLOMAX) 0.4 MG CAPS capsule Take 1 capsule (0.4 mg total) by mouth daily.   [DISCONTINUED] traMADol (ULTRAM) 50 MG tablet Take 1-2 tablets (50-100 mg total) by mouth every 6 (six) hours as needed for moderate pain.   No facility-administered encounter medications on file as of 11/26/2022.    Allergies as of 11/26/2022 - Review Complete 11/26/2022  Allergen Reaction Noted   Other Rash, Hives, Itching, Swelling, and Other (See Comments) 09/23/2011   Sulfa antibiotics Other (See Comments) 09/23/2011   Erythromycin Other (See Comments) 04/06/2021   Tape Itching and Swelling 01/27/2017    Past Medical History:  Diagnosis Date   Allergic    Angio-edema    Chest pain    Cholecystitis, acute with cholelithiasis 08/18/2019   Endometriosis    Hepatic focal nodular hyperplasia    History of kidney stones    History of supraventricular tachycardia 05-09-2019  pt denies any cardiac S&S including no palpitations   03-11-2017   s/p  ablation by dr g. taylor;  primary cardiologist dr Tresa Endo,  pt released on as needed basis   Hypertension    Leukocytosis    Migraines    Mitral valve prolapse    Other allergic rhinitis 05/15/2020   Paroxysmal SVT (supraventricular tachycardia)    Seasonal allergies  Ureteropelvic junction calculus    left side   Urticaria     Past Surgical History:  Procedure Laterality Date   CHOLECYSTECTOMY N/A 08/19/2019   Procedure: LAPAROSCOPIC CHOLECYSTECTOMY WITH INTRAOPERATIVE CHOLANGIOGRAM;  Surgeon: Darnell Level, MD;  Location: [facility];  Service: General;  Laterality: N/A;   EXCISION OF BREAST BIOPSY Right 07-21-2000   dr Ezzard Standing @[facility]    EXTRACORPOREAL SHOCK WAVE LITHOTRIPSY Left 03/25/2022   Procedure: LEFT EXTRACORPOREAL SHOCK WAVE LITHOTRIPSY (ESWL);  Surgeon: Crist Fat, MD;  Location: [facility];  Service: Urology;  Laterality: Left;   HOLMIUM LASER APPLICATION  05/11/2019   Procedure: HOLMIUM LASER APPLICATION;  Surgeon: Ihor Gully, MD;  Location: [facility];  Service: Urology;;   PELVIC LAPAROSCOPY  1990s   endometriosis   PLANTAR FASCIA SURGERY Left 2018   SVT ABLATION N/A 03/11/2017   Procedure: SVT Ablation;  Surgeon: Marinus Maw, MD;  Location: [facility] INVASIVE CV LAB;  Service: Cardiovascular;  Laterality: N/A;   URETEROSCOPY WITH HOLMIUM LASER LITHOTRIPSY Left 05/11/2019   Procedure: URETEROSCOPY,RETROGRADE WITH HOLMIUM LASER LITHOTRIPSY, STENT PLACEMENT;  Surgeon: Ihor Gully, MD;  Location: [facility];  Service: Urology;  Laterality: Left;   WRIST SURGERY Left 2012   cyst removal    Family History  Problem Relation Age of Onset   Diabetes Mother        did of complications of C-Diff   Parkinson's disease Father     Social History   Socioeconomic History   Marital status: Single    Spouse name: Not on file   Number of children: Not on file   Years of education: Not on file   Highest education level: Not on file  Occupational History   Not on file  Tobacco Use   Smoking status: Never   Smokeless tobacco: Never  Vaping Use   Vaping Use: Never used  Substance and Sexual Activity   Alcohol use: Yes    Comment: occasional   Drug use: Never   Sexual activity: Not on file  Other Topics Concern   Not on file  Social History Narrative   Not on file   Social Determinants of Health   Financial Resource Strain: Not on file  Food Insecurity: Not on file  Transportation Needs: Not on file  Physical Activity: Not on file  Stress: Not on file  Social Connections: Not on file  Intimate Partner Violence: Not on file    Review of Systems  Respiratory:  Positive for cough and wheezing.     Vitals:   11/26/22 1520  BP: 132/82  Pulse: 99  SpO2: 99%     Physical Exam Constitutional:      Appearance: She  is obese.  HENT:     Head: Normocephalic.     Nose: Nose normal.     Mouth/Throat:     Mouth: Mucous membranes are moist.  Eyes:     General: No scleral icterus.    Pupils: Pupils are equal, round, and reactive to light.  Cardiovascular:     Rate and Rhythm: Normal rate and regular rhythm.     Heart sounds: No murmur heard.    No friction rub.  Pulmonary:     Effort: No respiratory distress.     Breath sounds: No stridor. No wheezing or rhonchi.  Musculoskeletal:     Cervical back: No rigidity or tenderness.  Neurological:     Mental Status: She is alert.  Psychiatric:  Mood and Affect: Mood normal.    Data Reviewed: Recent chest x-ray did not reveal any significant abnormality  Head and neck CT recently that did not show any significant abnormality  Assessment:  Chronic cough -May be a postinfectious cough -Airway hyperactivity/sensitivity may also be playing a role -Cough variant asthma is a possibility  Denies symptoms suggesting reflux at present  Although does have a history of rhinitis, does not seem to be acting up recently  Plan/Recommendations: Prescription for doxycycline to be used for 10 days  Will start Breo inhaler  Delsym may be tried for cough and  Further evaluation with a pulmonary function test when more stable  Encouraged to call with any significant concerns  Tentative follow-up in about 4 to 6 weeks     Virl Diamond MD [facility] 11/26/2022, 3:56 PM  CC: Joycelyn Rua, MD

## 2022-12-31 ENCOUNTER — Ambulatory Visit: Payer: BC Managed Care – PPO | Admitting: Pulmonary Disease

## 2022-12-31 ENCOUNTER — Encounter: Payer: Self-pay | Admitting: Pulmonary Disease

## 2022-12-31 VITALS — BP 118/74 | HR 86 | Ht 71.5 in | Wt 245.0 lb

## 2022-12-31 DIAGNOSIS — R053 Chronic cough: Secondary | ICD-10-CM | POA: Diagnosis not present

## 2022-12-31 NOTE — Patient Instructions (Addendum)
I will see you tentatively in about 6 months  You may stop the Breo at present If there is an escalation of your cough and following holding off on the Breo then resume the [facility] and try to use it for a few more months, this helps the breathing tube just stabilize  The appointment in 6 months may be canceled if you are feeling well and we will just see you as needed  You are welcome to call with any significant concerns

## 2022-12-31 NOTE — Progress Notes (Signed)
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  Nonallergic rhinitis   Outpatient Encounter Medications as of 12/31/2022  Medication Sig   aspirin 81 MG tablet Take 81 mg by mouth every evening.    cetirizine (ZYRTEC) 10 MG tablet Take 10 mg by mouth 2 (two) times daily.    fluticasone furoate-vilanterol (BREO ELLIPTA) 100-25 MCG/ACT AEPB Inhale 1 puff into the lungs daily.   levonorgestrel-ethinyl estradiol (SEASONALE,INTROVALE,JOLESSA) 0.15-0.03 MG tablet Take 1 tablet by mouth every evening.    omeprazole (PRILOSEC) 40 MG capsule Take 40 mg by mouth at bedtime.   propranolol ER (INDERAL LA) 60 MG 24 hr capsule Take 1 capsule (60 mg total) by mouth daily.   SUMAtriptan (IMITREX) 50 MG tablet Take 1 tablet by mouth every 2 (two) hours as needed for migraine. Take as directed for migraines   doxycycline (VIBRA-TABS) 100 MG tablet Take 1 tablet (100 mg total) by mouth 2 (two) times daily.   No facility-administered encounter medications on file as of 12/31/2022.    Allergies as of 12/31/2022 - Review Complete 12/31/2022  Allergen  Reaction Noted   Other Rash, Hives, Itching, Swelling, and Other (See Comments) 09/23/2011   Sulfa antibiotics Other (See Comments) 09/23/2011   Erythromycin Other (See Comments) 04/06/2021   Tape Itching and Swelling 01/27/2017   Wound dressing adhesive Rash 01/27/2017    Past Medical History:  Diagnosis Date   Allergic    Angio-edema    Chest pain    Cholecystitis, acute with cholelithiasis 08/18/2019   Endometriosis    Hepatic focal nodular hyperplasia    History of kidney stones    History of supraventricular tachycardia 05-09-2019  pt denies any cardiac S&S including no palpitations   03-11-2017   s/p  ablation by dr g. taylor;  primary cardiologist dr Tresa Endo,  pt released on as needed basis   Hypertension    Leukocytosis    Migraines    Mitral valve prolapse    Other allergic rhinitis 05/15/2020   Paroxysmal SVT (supraventricular tachycardia)    Seasonal allergies    Ureteropelvic junction calculus    left side   Urticaria     Past Surgical History:  Procedure Laterality Date   CHOLECYSTECTOMY N/A 08/19/2019   Procedure: LAPAROSCOPIC CHOLECYSTECTOMY WITH INTRAOPERATIVE CHOLANGIOGRAM;  Surgeon: Darnell Level, MD;  Location: [facility];  Service: General;  Laterality: N/A;   EXCISION OF BREAST BIOPSY Right 07-21-2000   dr Ezzard Standing @[facility]    EXTRACORPOREAL SHOCK WAVE LITHOTRIPSY Left 03/25/2022   Procedure: LEFT  EXTRACORPOREAL SHOCK WAVE LITHOTRIPSY (ESWL);  Surgeon: Crist Fat, MD;  Location: [facility];  Service: Urology;  Laterality: Left;   HOLMIUM LASER APPLICATION  05/11/2019   Procedure: HOLMIUM LASER APPLICATION;  Surgeon: Ihor Gully, MD;  Location: [facility];  Service: Urology;;   PELVIC LAPAROSCOPY  1990s   endometriosis   PLANTAR FASCIA SURGERY Left 2018   SVT ABLATION N/A 03/11/2017   Procedure: SVT Ablation;  Surgeon: Marinus Maw, MD;  Location: [facility] INVASIVE CV LAB;  Service: Cardiovascular;  Laterality: N/A;   URETEROSCOPY WITH  HOLMIUM LASER LITHOTRIPSY Left 05/11/2019   Procedure: URETEROSCOPY,RETROGRADE WITH HOLMIUM LASER LITHOTRIPSY, STENT PLACEMENT;  Surgeon: Ihor Gully, MD;  Location: [facility];  Service: Urology;  Laterality: Left;   WRIST SURGERY Left 2012   cyst removal    Family History  Problem Relation Age of Onset   Diabetes Mother        did of complications of C-Diff   Parkinson's disease Father     Social History   Socioeconomic History   Marital status: Single    Spouse name: Not on file   Number of children: Not on file   Years of education: Not on file   Highest education level: Not on file  Occupational History   Not on file  Tobacco Use   Smoking status: Never   Smokeless tobacco: Never  Vaping Use   Vaping Use: Never used  Substance and Sexual Activity   Alcohol use: Yes    Comment: occasional   Drug use: Never   Sexual activity: Not on file  Other Topics Concern   Not on file  Social History Narrative   Not on file   Social Determinants of Health   Financial Resource Strain: Not on file  Food Insecurity: Not on file  Transportation Needs: Not on file  Physical Activity: Not on file  Stress: Not on file  Social Connections: Not on file  Intimate Partner Violence: Not on file    Review of Systems  Respiratory:  Positive for cough and wheezing.     Vitals:   12/31/22 1550  BP: 118/74  Pulse: 86  SpO2: 98%     Physical Exam Constitutional:      Appearance: She is obese.  HENT:     Head: Normocephalic.     Nose: Nose normal.     Mouth/Throat:     Mouth: Mucous membranes are moist.  Eyes:     General: No scleral icterus.    Pupils: Pupils are equal, round, and reactive to light.  Cardiovascular:     Rate and Rhythm: Normal rate and regular rhythm.     Heart sounds: No murmur heard.    No friction rub.  Pulmonary:     Effort: No respiratory distress.     Breath sounds: No stridor. No wheezing or rhonchi.  Musculoskeletal:      Cervical back: No rigidity or tenderness.  Neurological:     Mental Status: She is alert.  Psychiatric:        Mood and Affect: Mood normal.    Data Reviewed: Recent chest x-ray did not reveal any significant abnormality  Head and neck CT recently that did not show any significant abnormality  Assessment:  Chronic cough -Cough is significantly better -May have had airway hyperresponsiveness related into a respiratory tract infection that was not very symptomatic -Possibility of cough variant asthma also was discussed during the last visit  I  do believe it is safe to hold Breo at present -If recurrence of symptoms with Breo hold then she is advised to resume using Breo for few more months to allow for the airway to settle  Plan/Recommendations:  Tentatively follow-up in about 6 months-May be canceled if she is feeling well  Encouraged to resume Breo if resurgence of a cough  Do not need any further testing at the present time   Encouraged to call with significant concerns     Virl Diamond MD  [facility] and Critical Care 12/31/2022, 4:03 PM  CC: Joycelyn Rua, MD

## 2023-01-28 ENCOUNTER — Other Ambulatory Visit: Payer: Self-pay | Admitting: Pulmonary Disease

## 2023-08-11 ENCOUNTER — Ambulatory Visit: Payer: BC Managed Care – PPO

## 2023-08-11 ENCOUNTER — Encounter: Payer: Self-pay | Admitting: Internal Medicine

## 2023-08-11 ENCOUNTER — Ambulatory Visit: Payer: BC Managed Care – PPO | Admitting: Internal Medicine

## 2023-08-11 VITALS — BP 138/82 | HR 99 | Temp 98.2°F | Ht 72.0 in | Wt 240.8 lb

## 2023-08-11 DIAGNOSIS — R058 Other specified cough: Secondary | ICD-10-CM

## 2023-08-11 LAB — POCT EXHALED NITRIC OXIDE: FeNO level (ppb): 47

## 2023-08-11 MED ORDER — PANTOPRAZOLE SODIUM 40 MG PO TBEC: 40.0000 mg | DELAYED_RELEASE_TABLET | Freq: Every day | ORAL | 2 refills | Status: AC

## 2023-08-11 MED ORDER — TRAMADOL HCL 50 MG PO TABS: 50.0000 mg | ORAL_TABLET | ORAL | 0 refills | Status: AC | PRN

## 2023-08-11 MED ORDER — FAMOTIDINE 20 MG PO TABS: ORAL_TABLET | ORAL | 11 refills | Status: DC

## 2023-08-11 MED ORDER — METHYLPREDNISOLONE ACETATE 80 MG/ML IJ SUSP
120.0000 mg | Freq: Once | INTRAMUSCULAR | Status: AC
  Administered 2023-08-11: 120 mg via INTRAMUSCULAR

## 2023-08-11 NOTE — Patient Instructions (Addendum)
The key to effective treatment for your cough is eliminating the non-stop cycle of cough you're stuck in long enough to let your airway heal completely and then see if there is anything still making you cough once you stop the cough suppression, but this should take no more than 5 days to figure out  First take delsym two tsp every 12 hours and supplement if needed with  tramadol 50 mg up to 2 every 4 hours to suppress the urge to cough at all or even clear your throat. Swallowing water or using ice chips/non mint and menthol containing candies (such as lifesavers or sugarless jolly ranchers) are also effective.  You should rest your voice and avoid activities that you know make you cough.  Once you have eliminated the cough for 3 straight days try reducing the tramadol first,  then the delsym as tolerated.    Depomedrol 120 mg   Protonix (pantoprazole) Take 30-60 min before first meal of the day and Pepcid 20 mg one bedtime plus chlorpheniramine 4 mg x 2 at bedtime (both available over the counter)  until cough is completely gone for at least a week without the need for cough suppression  GERD (REFLUX)  is an extremely common cause of respiratory symptoms, many times with no significant heartburn at all.    It can be treated with medication, but also with lifestyle changes including avoidance of late meals, excessive alcohol, smoking cessation, and avoid fatty foods, chocolate, peppermint, colas, red wine, and acidic juices such as orange juice.  NO MINT OR MENTHOL PRODUCTS SO NO COUGH DROPS  USE HARD CANDY INSTEAD (jolley ranchers or Stover's or Lifesavers (all available in sugarless versions) NO OIL BASED VITAMINS - use powdered substitutes.  Please remember to go to the  x-ray department  for your tests - we will call you with the results when they are available    Follow up is as needed

## 2023-08-12 ENCOUNTER — Encounter: Payer: Self-pay | Admitting: Internal Medicine

## 2023-08-12 NOTE — Assessment & Plan Note (Addendum)
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   These are the same pts (now being labeled as having "irritable larynx syndrome" by some cough centers) who not infrequently have a history of having failed to tolerate ace inhibitors,  dry powder inhalers or biphosphonates or report having atypical/extraesophageal reflux symptoms that don't respond to standard doses of PPI  and are easily confused as having aecopd or asthma flares by even experienced allergists/ pulmonologists (myself included).   Of the three most common causes of  Sub-acute / recurrent or chronic cough, only one (GERD)  can actually contribute to/ trigger  the other two (asthma and post nasal drip syndrome)  and perpetuate the cylce of cough.  While not intuitively obvious, many patients with chronic low grade reflux do not cough until there is a primary insult that disturbs the protective epithelial barrier and exposes sensitive nerve endings.   This is typically viral but can due to PNDS and  either may apply here.     >>>The point is that once this occurs, it is difficult to eliminate the cycle  using anything but  a maximally effective acid suppression regimen at least in the short run, accompanied by an appropriate diet to address non acid GERD and control / eliminate the cough itself for at least 3 days with tramadol and 1st gen H1 blockers per guidelines    >>> >>> also so Depomedrol 120 mg IM  in case of component of Th-2 driven upper or lower airways inflammation (if cough responds short term only to relapse before return while will on full rx for uacs (as above), then  that would point to allergic rhinitis/ asthma or eos bronchitis as alternative dx) - if this is the case would have a low threshold to stop propranolol 1st rather than pile on chronic Beta Agonists which are unlikely to be effective in a pt on relatively high dose non -specific Beta antagonists   In future with onset of cough rec delsym and otc gerd rx to start immediately to see if cycle can be eliminated early   F/u in this clinic is prn         Each maintenance medication was reviewed in detail including emphasizing most importantly the difference between maintenance and prns and under what circumstances the prns are to be triggered using an action plan format where appropriate.  Total time for H and P, chart review, counseling, reviewing hfa/dpi device(s) and generating customized AVS unique to this office visit / same day charting > 40 min with acute pt new to me

## 2023-10-06 ENCOUNTER — Other Ambulatory Visit: Payer: Self-pay | Admitting: Medical Genetics

## 2023-11-07 ENCOUNTER — Other Ambulatory Visit (HOSPITAL_COMMUNITY)
Admission: RE | Admit: 2023-11-07 | Discharge: 2023-11-07 | Disposition: A | Discharge status: Home / Self Care | Payer: Self-pay | Source: Ambulatory Visit | Attending: Oncology | Admitting: Oncology

## 2023-11-20 LAB — GENECONNECT MOLECULAR SCREEN: Genetic Analysis Overall Interpretation: NEGATIVE

## 2024-04-06 ENCOUNTER — Other Ambulatory Visit: Payer: Self-pay | Admitting: Urology

## 2024-04-20 ENCOUNTER — Encounter (HOSPITAL_COMMUNITY): Payer: Self-pay | Admitting: Urology

## 2024-04-20 NOTE — Progress Notes (Signed)
 Spoke w/ via phone for pre-op  interview---[facility] needs dos---- KUB, CBG, UPT              COVID test -----patient states asymptomatic no test needed Arrive at -------0700 NPO after MN NO Solid Food.  Clear liquids from MN until--- Med rec completed. Pt aware to hold ASA/NSAIDs and supplements per [facility] protocol.  Medications to take morning of surgery ----- Diabetic/Weight loss medication ----- No Alcohol or recreational drugs for 24 hours/Tobacco products for 6 hours ---- Patient instructed to bring blue lithotripsy folder, photo id and insurance card day of surgery. Patient aware to have Driver (ride ) / caregiver for 24 hours after surgery -----[place] Patient Special Instructions -----wear clothes without metal belt buckles, buttons,or zippers. Also no sandals or flip flops. Pre-Op  special Instructions ----- take laxative of choice day before procedure Patient verbalized understanding of instructions that were given at this phone interview. Patient denies shortness of breath, chest pain, fever, cough at this phone interview.

## 2024-04-25 NOTE — H&P (Signed)
 I have kidney stones.  HPI: [name] [name] is a 53 year-old female established patient who is here for renal calculi.  01/07/20: She returns today for 6 month follow-up having undergone ureteroscopy for a large left renal calculus. She has been doing well. She has not had any flank pain or hematuria. She is asymptomatic today.   05/05/2021: 53 year old female who presents today for 1 year follow-up. She reports she has done very well over the past urine denies any interval stone passage, flank pain, gross hematuria, urinary tract infections. She is currently on continuous birth control due to breakthrough bleeding and reports that her urine always has microscopic hematuria. She underwent a negative microscopic hematuria evaluation. Her stone makeup is primarily calcium oxalate. She is not currently on any medications for management of this. She is managing it with diet.   08/19/21: 53 year old female who presents today for her 100-month follow-up regarding left-sided urolithiasis. Denies interval stone passage or discomfort. She is currently managing stone prevention with diet and increased water.   12/30/2021: [name] [name] is a 53 year old female who presents today for follow-up regarding left-sided nephrolithiasis. She currently has some microscopic hematuria however this is not uncommon for her. She denies any changes to her voiding habits. He denies flank pain and gross hematuria. She is a Chartered loss adjuster and would like to have lithotripsy on her left sided stones but would like to wait until summertime. She has had no passage of stone material in the interval.   02/25/2022: 53 year old female who is scheduled to undergo lithotripsy on a 6 mm left renal stone presents today for preoperative appointment. She has had no complaints of pain or discomfort. She denies fevers and chills. She denies gross hematuria. She denies changes to her medications, allergies, medical history.   04/29/2022: 53 year old female who  presents today for 4-week follow-up after undergoing left-sided lithotripsy. She has passed multiple fragments and brings these with her today. She denies any pain, gross hematuria, urinary tract infections or changes to her voiding habits. She does have microscopic hematuria but this is chronic for her.   04/19/2023: 53 year old female who presents today for evaluation and management of nephrolithiasis. She has a stable left-sided renal stone and denies any interval passage of stone material. She will occasionally have left-sided flank pain but reports this occurs mostly at night when laying on her right side. She denies gross hematuria and dysuria. She has chronic microscopic hematuria.   04/06/2024: [name] [name] is a 53 year old female who presents today for yearly office evaluation of nephrolithiasis. She has a known left-sided renal stone. Last year this measured 6 mm. This year it looks to be around 1 cm. He has been experiencing some intermittent left flank pain associated with nausea comes and goes. She denies fevers, chills, gross hematuria.   The problem is on the left side. She first began experiencing symptoms 08/19/2018. This is not her first kidney stone. Her first stone was 09/18/2006. She has had 1 stones prior to getting this one. She is currently having flank pain. She denies having back pain, groin pain, nausea, vomiting, fever, and chills. She has not caught a stone in her urine strainer since her symptoms began.   She has never had surgical treatment for calculi in the past.     ALLERGIES: sulfa - fever    MEDICATIONS: Aspirin   ZyrTEC  Birth Control  Propranolol  HCl 60 MG Tablet     GU PSH: ESWL - 2023 Ureteroscopic laser litho, Left - 2020 Ureteroscopic stone removal,  Left - 2020     NON-GU PSH: Foot surgery (unspecified), Left Wrist Arthroscopy/surgery, Left     GU PMH: Renal calculus - 04/19/2023, - 04/29/2022, - 2023, - 2023, - 2022, - 2022, - 2020, - 2020, Left, She is going  to be scheduled for a CT scan to evaluate her left renal calculus and her right-sided flank pain., - 2020 History of urolithiasis - 2022, She has a history of calculus disease but has no evidence of recurrent stones. She will continue with her current diet and fluid intake regimen and return again in 1 year for KUB. 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 This appears to have been effective at helping prevent further stone formation and will continue this. - 2021      PMH Notes: Microscopic hematuria: She was evaluated for this by Dr. Aleene in 6/06 with a renal ultrasound which revealed left renal cyst but no other abnormality and negative cystoscopy at as well as an MP 22.   Left renal cyst: These were noted on ultrasound in 6/06.   Calculus disease: She passed a stone in 8/0 6.  Stone analysis: 90% calcium oxalate and 10% calcium phosphate.  Metabolic workup: Serum studies - normal  24 hour urine - hypocitraturia and hyperuricosuria   NON-GU PMH: No Non-GU PMH    FAMILY HISTORY: No Family History    SOCIAL HISTORY: Marital Status: Single Preferred Language: English; Ethnicity: Not Hispanic Or Latino; Race: White Current Smoking Status: Patient has never smoked.   Tobacco Use Assessment Completed: Used Tobacco in last 30 days? Does drink.  Does not drink caffeine.    REVIEW OF SYSTEMS:    GU Review Female:   Patient reports get up at night to urinate, burning /pain with urination, and hard to postpone urination. Patient denies being pregnant, have to strain to urinate, trouble starting your stream, frequent urination, leakage of urine, and stream starts and stops.  Gastrointestinal (Upper):   Patient denies nausea, vomiting, and indigestion/ heartburn.  Gastrointestinal (Lower):   Patient denies diarrhea and constipation.  Constitutional:   Patient denies fever, night sweats, weight loss, and fatigue.  Skin:   Patient denies skin  rash/ lesion and itching.  Eyes:   Patient denies blurred vision and double vision.  Ears/ Nose/ Throat:   Patient denies sore throat and sinus problems.  Hematologic/Lymphatic:   Patient denies swollen glands and easy bruising.  Cardiovascular:   Patient denies leg swelling and chest pains.  Respiratory:   Patient denies cough and shortness of breath.  Endocrine:   Patient denies excessive thirst.  Musculoskeletal:   Patient denies back pain and joint pain.  Neurological:   Patient denies headaches and dizziness.  Psychologic:   Patient denies depression and anxiety.   VITAL SIGNS:      04/06/2024 08:44 AM  BP 125/84 mmHg  Pulse 81 /min  Temperature 97.5 F / 36.3 C   MULTI-SYSTEM PHYSICAL EXAMINATION:    Constitutional: Well-nourished. No physical deformities. Normally developed. Good grooming.  Cardiovascular: Normal temperature, normal extremity pulses, no swelling, no varicosities.  Skin: No paleness, no jaundice, no cyanosis. No lesion, no ulcer, no rash.  Neurologic / Psychiatric: Oriented to time, oriented to place, oriented to person. No depression, no anxiety, no agitation.  Gastrointestinal: No mass, no tenderness, no rigidity, non obese abdomen.     Complexity of Data:  Records Review:   Previous Doctor Records, Previous Patient Records  X-Ray Review:  KUB: Reviewed Films. Reviewed Report. Discussed With Patient.  Renal Ultrasound: Reviewed Films. Reviewed Report. Discussed With Patient.     PROCEDURES:         KUB - [id number]  A single view of the abdomen is obtained. AT the L1 level there is a 1cm opacity in the left renal shadow. There appears to be a right lower pole opacity as well measuring approx 3-7mm      Patient confirmed No Neulasta OnPro Device.            Renal Ultrasound - [id number]  Right Kidney: Length: 13.2 cm Depth: 6.6 cm Cortical Width: 1.2 cm Width: 6.0 cm  Left Kidney: Length: 14.5 cm Depth: 7.6 cm Cortical Width: 1.5 cm Width: 6.7 cm  Left  Kidney/Ureter:  Multiple parapelvic cysts. Multiple non-obstructing calcifications   Right Kidney/Ureter:  Multiple parapelvic cysts  Bladder:  PVR 22.98 ml      Patient confirmed No Neulasta OnPro Device.           Visit Complexity - G2211    ASSESSMENT:      ICD-10 Details  1 GU:   Renal calculus - N20.0 Left, Chronic, Worsening   PLAN:            Medications New Meds: Tamsulosin  HCl 0.4 MG Capsule 1 capsule PO Q HS   #30  1 Refill(s)  oxyCODONE -Acetaminophen  5-325 MG Tablet 1 tablet PO Q 6 H PRN for kidney stone pain  #10  0 Refill(s)  Pharmacy Name:  [facility] [phone]  Address:  [address]   [place], [place] [address]  Phone:  [phone]  Fax:  [phone]            Document Letter(s):  Created for Patient: Clinical Summary         Notes:   KUB shows a left renal stone that has increased in size since last year. This now measures approximately 1 cm. We discussed options and she would like to proceed with definitive stone intervention. Urinalysis will be sent for precautionary culture today. Stone intervention was discussed in detail today. For ureteroscopy, the patient understands that there is a chance for a staged procedure. Patient also understands that there is risk for bleeding, infection, injury to surrounding organs, and general risks of anesthesia. The patient also understands the placement of a stent and the risks of stent placement including, risk for infection, the risk for pain, and the risk for injury. For ESWL, the patient understands that there is a chance of failure of procedure, there is also a risk for bruising, infection, bleeding, and injury to surrounding structures. The patient verbalized understanding to these risks.   After thorough discussion of options she would like to proceed with left lithotripsy. Green sheet was placed today.        Next Appointment:      Next Appointment: 04/27/2024 09:00 AM    Appointment Type: Surgery     Location:  [facility] [phone]    Provider: Norleen Seltzer, M.D.    Reason for Visit: [facility]/OP-LT ESWL

## 2024-04-27 ENCOUNTER — Ambulatory Visit (HOSPITAL_COMMUNITY)

## 2024-04-27 ENCOUNTER — Encounter (HOSPITAL_COMMUNITY)
Admission: RE | Disposition: A | Discharge status: Home / Self Care | Payer: Self-pay | Source: Home / Self Care | Attending: Urology

## 2024-04-27 ENCOUNTER — Ambulatory Visit (HOSPITAL_COMMUNITY)
Admission: RE | Admit: 2024-04-27 | Discharge: 2024-04-27 | Disposition: A | Discharge status: Home / Self Care | Attending: Urology | Admitting: Urology

## 2024-04-27 ENCOUNTER — Encounter (HOSPITAL_COMMUNITY): Payer: Self-pay | Admitting: Urology

## 2024-04-27 ENCOUNTER — Other Ambulatory Visit: Payer: Self-pay

## 2024-04-27 DIAGNOSIS — I10 Essential (primary) hypertension: Secondary | ICD-10-CM | POA: Insufficient documentation

## 2024-04-27 DIAGNOSIS — I499 Cardiac arrhythmia, unspecified: Secondary | ICD-10-CM | POA: Diagnosis not present

## 2024-04-27 DIAGNOSIS — Z7982 Long term (current) use of aspirin: Secondary | ICD-10-CM | POA: Diagnosis not present

## 2024-04-27 DIAGNOSIS — N200 Calculus of kidney: Secondary | ICD-10-CM | POA: Insufficient documentation

## 2024-04-27 HISTORY — PX: EXTRACORPOREAL SHOCK WAVE LITHOTRIPSY: SHX1557

## 2024-04-27 LAB — POCT PREGNANCY, URINE: Preg Test, Ur: NEGATIVE

## 2024-04-27 SURGERY — LITHOTRIPSY, ESWL
Anesthesia: LOCAL | Laterality: Left

## 2024-04-27 MED ORDER — ALFUZOSIN HCL ER 10 MG PO TB24: 10.0000 mg | ORAL_TABLET | Freq: Every day | ORAL | 0 refills | Status: AC

## 2024-04-27 MED ORDER — DIAZEPAM 5 MG PO TABS
10.0000 mg | ORAL_TABLET | ORAL | Status: AC
  Administered 2024-04-27: 10 mg via ORAL
  Filled 2024-04-27: qty 2

## 2024-04-27 MED ORDER — SODIUM CHLORIDE 0.9% FLUSH: 3.0000 mL | Freq: Two times a day (BID) | INTRAVENOUS | Status: DC

## 2024-04-27 MED ORDER — CIPROFLOXACIN HCL 500 MG PO TABS
500.0000 mg | ORAL_TABLET | ORAL | Status: AC
  Administered 2024-04-27: 500 mg via ORAL
  Filled 2024-04-27: qty 1

## 2024-04-27 MED ORDER — SODIUM CHLORIDE 0.9 % IV SOLN
INTRAVENOUS | Status: DC
  Administered 2024-04-27: 1000 mL via INTRAVENOUS

## 2024-04-27 MED ORDER — DIPHENHYDRAMINE HCL 25 MG PO CAPS
25.0000 mg | ORAL_CAPSULE | ORAL | Status: AC
  Administered 2024-04-27: 25 mg via ORAL
  Filled 2024-04-27: qty 1

## 2024-04-27 MED ORDER — OXYCODONE-ACETAMINOPHEN 5-325 MG PO TABS: 1.0000 | ORAL_TABLET | Freq: Four times a day (QID) | ORAL | 0 refills | Status: AC | PRN

## 2024-04-27 NOTE — Discharge Instructions (Addendum)
 I have sent a script for alfuzosin.  This medicine is what is known as an alpha blocker and it helps relax the kidney tube to aid stone passage.   The side effects can be nasal congestion, some lightheadedness with standing and occasionally urinary leakage.  It would be wise to have the pharmacy fill the pain med that you were given just to have it on hand in case you have post op pain.   Keep f/u on 7/25 at 9am as planned.

## 2024-04-27 NOTE — Op Note (Signed)
 See [facility] op note

## 2024-04-30 ENCOUNTER — Encounter (HOSPITAL_COMMUNITY): Payer: Self-pay | Admitting: Urology

## 2024-05-11 LAB — COLOGUARD: COLOGUARD: NEGATIVE

## 2024-07-09 ENCOUNTER — Encounter (INDEPENDENT_AMBULATORY_CARE_PROVIDER_SITE_OTHER): Payer: Self-pay

## 2024-07-22 ENCOUNTER — Other Ambulatory Visit: Payer: Self-pay | Admitting: Internal Medicine

## 2024-07-22 DIAGNOSIS — R058 Other specified cough: Secondary | ICD-10-CM

## 2024-08-06 ENCOUNTER — Encounter (INDEPENDENT_AMBULATORY_CARE_PROVIDER_SITE_OTHER): Payer: Self-pay | Admitting: Physician Assistant

## 2024-08-06 ENCOUNTER — Ambulatory Visit (INDEPENDENT_AMBULATORY_CARE_PROVIDER_SITE_OTHER): Admitting: Physician Assistant

## 2024-08-06 VITALS — BP 125/76 | HR 92 | Temp 97.9°F | Ht 72.0 in | Wt 240.0 lb

## 2024-08-06 DIAGNOSIS — R053 Chronic cough: Secondary | ICD-10-CM

## 2024-08-06 DIAGNOSIS — K219 Gastro-esophageal reflux disease without esophagitis: Secondary | ICD-10-CM

## 2024-08-06 DIAGNOSIS — E079 Disorder of thyroid, unspecified: Secondary | ICD-10-CM | POA: Diagnosis not present

## 2024-08-06 MED ORDER — AZELASTINE HCL 0.1 % NA SOLN
2.0000 | Freq: Two times a day (BID) | NASAL | 12 refills | Status: AC
Start: 2024-08-06 — End: ?

## 2024-08-06 NOTE — Progress Notes (Signed)
 Dear Dr. Lennice, Here is my assessment for our mutual patient, [name] [name]. Thank you for allowing me the opportunity to care for your patient. Please do not hesitate to contact me should you have any other questions. Sincerely, Chyrl Cohen PA-C  Otolaryngology Clinic Note Referring provider: Dr. Lennice HPI:  [name] [name] is a 53 y.o. female kindly referred by Dr. Lennice Niels DELENA [name] [name] is a 53 year old female with silent reflux and asthma who presents with a recurrent seasonal cough.  She experiences a recurrent cough that begins each August when she returns to work at [employer]. The cough progressively worsens and has been a recurring issue for the past three years. It is severe enough to induce vomiting and cause her to nearly pass out due to an inability to catch her breath, typically lasting for several months.  Over the past three years, she has consulted with ENTs, allergists, and pulmonologists. Initial diagnoses included silent reflux and asthma. Various diagnostic tests, including chest x-rays, CT scans, and scopes, were performed as part of her workup. Treatments have included omeprazole, famotidine , inhalers, and steroid shots, with varying degrees of success.  This year, the cough returned, and she received a steroid shot and prescription cough medicine from her GP, which provided limited relief. She has been taking famotidine  since last year and was recently started on Singulair, which, along with the steroid shot, seems to have finally calmed the cough.  Extensive allergy testing revealed no significant allergies, although past tests indicated some seasonal allergies, mold, roaches, horses, and pet dander. She experiences a sensation of something stuck in her throat, frequent throat clearing, and occasional postnasal drainage when the symptoms are present. No significant correlation between her symptoms and specific foods, although she avoids eating at work to  prevent exacerbating her symptoms.  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 She also notes a soft, mushy swelling in her neck that appeared three years ago, coinciding with the onset of her cough, but CT scans have shown no abnormalities.  Current medications include famotidine  and Singulair. She has previously used various cough medicines, steroids, and tramadol , which provided some relief. She avoids Flonase due to migraines and has not noticed any significant impact from dietary changes.  Independent Review of Additional Tests or Records:  None   PMH/Meds/All/SocHx/FamHx/ROS:   Past Medical History:  Diagnosis Date   Allergic    Angio-edema    Chest pain    Cholecystitis, acute with cholelithiasis 08/18/2019   Endometriosis    Hepatic focal nodular hyperplasia    History of kidney stones    History of supraventricular tachycardia 05-09-2019  pt denies any cardiac S&S including no palpitations   03-11-2017   s/p  ablation by dr g. taylor;  primary cardiologist dr burnard,  pt released on as needed basis   Hypertension    Leukocytosis    Migraines    Mitral valve prolapse    Other allergic rhinitis 05/15/2020   Paroxysmal SVT (supraventricular tachycardia)    Seasonal allergies    Ureteropelvic junction calculus    left side   Urticaria      Past Surgical History:  Procedure Laterality Date   CHOLECYSTECTOMY N/A 08/19/2019   Procedure: LAPAROSCOPIC CHOLECYSTECTOMY WITH INTRAOPERATIVE CHOLANGIOGRAM;  Surgeon: Eletha Boas, MD;  Location: [facility];  Service: General;  Laterality: N/A;  EXCISION OF BREAST BIOPSY Right 07-21-2000   dr ethyl @[facility]    EXTRACORPOREAL SHOCK WAVE LITHOTRIPSY Left 03/25/2022   Procedure: LEFT EXTRACORPOREAL SHOCK WAVE LITHOTRIPSY (ESWL);  Surgeon: Cam Morene ORN, MD;  [facility];  Service: Urology;   Laterality: Left;   EXTRACORPOREAL SHOCK WAVE LITHOTRIPSY Left 04/27/2024   Procedure: LITHOTRIPSY, ESWL;  Surgeon: Watt Rush, MD;  Location: [facility];  Service: Urology;  Laterality: Left;   HOLMIUM LASER APPLICATION  05/11/2019   Procedure: HOLMIUM LASER APPLICATION;  Surgeon: Ottelin, Mark, MD;  Location: [facility];  Service: Urology;;   PELVIC LAPAROSCOPY  1990s   endometriosis   PLANTAR FASCIA SURGERY Left 2018   SVT ABLATION N/A 03/11/2017   Procedure: SVT Ablation;  Surgeon: Waddell Danelle ORN, MD;  Location: [facility];  Service: Cardiovascular;  Laterality: N/A;   URETEROSCOPY WITH HOLMIUM LASER LITHOTRIPSY Left 05/11/2019   Procedure: URETEROSCOPY,RETROGRADE WITH HOLMIUM LASER LITHOTRIPSY, STENT PLACEMENT;  Surgeon: Ottelin, Mark, MD;  Location: [facility];  Service: Urology;  Laterality: Left;   WRIST SURGERY Left 2012   cyst removal    Family History  Problem Relation Age of Onset   Diabetes Mother        did of complications of C-Diff   Parkinson's disease Father      Social Connections: Unknown (10/12/2022)   Received from [facility]   Social Network    Social Network: Not on file      Current Outpatient Medications:    aspirin  81 MG tablet, Take 81 mg by mouth every evening. , Disp: , Rfl:    cetirizine (ZYRTEC) 10 MG tablet, Take 10 mg by mouth daily., Disp: , Rfl:    famotidine  (PEPCID ) 20 MG tablet, TAKE 1 TABLET BY MOUTH AFTER SUPPER, Disp: 90 tablet, Rfl: 0   levonorgestrel-ethinyl estradiol (SEASONALE,INTROVALE,JOLESSA) 0.15-0.03 MG tablet, Take 1 tablet by mouth every evening. , Disp: , Rfl:    montelukast (SINGULAIR) 10 MG tablet, Take 10 mg by mouth daily., Disp: , Rfl:    alfuzosin  (UROXATRAL ) 10 MG 24 hr tablet, Take 1 tablet (10 mg total) by mouth daily with breakfast. (Patient not taking: Reported on 08/06/2024), Disp: 30 tablet, Rfl: 0   oxyCODONE -acetaminophen  (PERCOCET) 5-325 MG tablet, Take 1 tablet by mouth every  6 (six) hours as needed for severe pain (pain score 7-10). (Patient not taking: Reported on 08/06/2024), Disp: 12 tablet, Rfl: 0   pantoprazole  (PROTONIX ) 40 MG tablet, Take 1 tablet (40 mg total) by mouth daily. Take 30-60 min before first meal of the day (Patient not taking: Reported on 08/06/2024), Disp: 30 tablet, Rfl: 2   propranolol  ER (INDERAL  LA) 60 MG 24 hr capsule, Take 1 capsule (60 mg total) by mouth daily., Disp: 90 capsule, Rfl: 3   SUMAtriptan  (IMITREX ) 50 MG tablet, Take 1 tablet by mouth every 2 (two) hours as needed for migraine. Take as directed for migraines, Disp: , Rfl:    Physical Exam:   There were no vitals taken for this visit.  Pertinent Findings  CN II-XII intact Bilateral EAC clear and TM intact with well pneumatized middle ear spaces Anterior rhinoscopy: Septum midline; bilateral inferior turbinates with no hypertrophy No lesions of oral cavity/oropharynx; dentition in normal limits No obviously palpable neck masses/lymphadenopathy/thyromegaly No respiratory distress or stridor  Seprately Identifiable Procedures:  None  Impression & Plans:  Farha Dano is a 53 y.o. 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 Previous treatments provided limited relief. Current management shows improvement. Discussed dietary modifications and reflux management. Reflux Gourmet suggested as a safer alternative to long-term proton pump inhibitors.  We did discuss nasal endoscopy although she has had this several times, not currently having symptoms we will hold off on this for now.  - Continue famotidine  daily. - Consider starting Reflux Gourmet for reflux management. - Use saline nasal irrigation regularly. - Start azelastine nasal spray when symptoms begin. - Consider daily  antihistamine use. - Avoid large meals, spicy foods, and eating before bed.  Thyroid swelling, possible nodule  Intermittent thyroid swelling with no significant findings on previous CT scan. Possible thyroid nodule or other pathology. - Order thyroid ultrasound to evaluate for nodules or other abnormalities. - Follow up with results of thyroid ultrasound.   - f/u PRN   Thank you for allowing me the opportunity to care for your patient. Please do not hesitate to contact me should you have any other questions.    Sincerely, Chyrl Cohen PA-C [facility] Phone: [phone] Fax: [phone]  08/06/2024, 3:02 PM

## 2024-08-06 NOTE — Patient Instructions (Signed)
 I have ordered an imaging study for you to complete prior to your next visit. Please call [facility] at [phone] to schedule your imaging if you have not received a call within 24 hours. If you are unable to complete your imaging study prior to your next scheduled visit please call our office to let us  know.

## 2024-08-09 ENCOUNTER — Institutional Professional Consult (permissible substitution) (INDEPENDENT_AMBULATORY_CARE_PROVIDER_SITE_OTHER): Admitting: Otolaryngology

## 2024-08-10 ENCOUNTER — Ambulatory Visit (INDEPENDENT_AMBULATORY_CARE_PROVIDER_SITE_OTHER)

## 2024-08-10 ENCOUNTER — Other Ambulatory Visit

## 2024-08-10 DIAGNOSIS — E079 Disorder of thyroid, unspecified: Secondary | ICD-10-CM

## 2024-08-16 ENCOUNTER — Telehealth (INDEPENDENT_AMBULATORY_CARE_PROVIDER_SITE_OTHER): Payer: Self-pay

## 2024-08-16 ENCOUNTER — Telehealth (INDEPENDENT_AMBULATORY_CARE_PROVIDER_SITE_OTHER): Payer: Self-pay | Admitting: Physician Assistant

## 2024-08-16 NOTE — Telephone Encounter (Signed)
 Called to discuss ultrasound results with the patient.  She is noted to have subcentimeter thyroid nodules that do not meet criteria for FNA or imaging follow-up.  She also had mild prominent bilateral submandibular lymph nodes which are most likely reactive, recommend follow-up ultrasound in 3 months to document stability.  I left a voicemail for her to call back to discuss the results.

## 2024-08-16 NOTE — Telephone Encounter (Signed)
 Patient LVM returning Jeff's call.

## 2024-08-17 ENCOUNTER — Telehealth (INDEPENDENT_AMBULATORY_CARE_PROVIDER_SITE_OTHER): Payer: Self-pay | Admitting: Physician Assistant

## 2024-08-17 DIAGNOSIS — R599 Enlarged lymph nodes, unspecified: Secondary | ICD-10-CM

## 2024-08-17 NOTE — Telephone Encounter (Signed)
 I spoke with [name] [name] today with regards to her ultrasound thyroid.  Results noted below.  I have placed an order for repeat ultrasound in 3 months, I will call her with the results, she will reach out to the office if she develops any new or worsening signs or symptoms in the meantime.  IMPRESSION: 1. Subcentimeter thyroid nodules do not meet criteria for FNA or imaging follow-up. 2. Mildly prominent BILATERAL submandibular lymph nodes are most likely reactive. Recommend follow-up ultrasound in 3 months to document stability.

## 2024-10-14 ENCOUNTER — Other Ambulatory Visit: Payer: Self-pay | Admitting: Internal Medicine

## 2024-10-14 DIAGNOSIS — R058 Other specified cough: Secondary | ICD-10-CM

## 2024-11-12 ENCOUNTER — Ambulatory Visit

## 2024-11-12 DIAGNOSIS — R599 Enlarged lymph nodes, unspecified: Secondary | ICD-10-CM

## 2024-11-15 ENCOUNTER — Telehealth (INDEPENDENT_AMBULATORY_CARE_PROVIDER_SITE_OTHER): Payer: Self-pay | Admitting: Physician Assistant

## 2024-11-15 NOTE — Telephone Encounter (Signed)
 I spoke with the patient today, repeat ultrasound completed yesterday again shows benign-appearing lymph nodes with no concerning findings.  I recommend the patient reach out again if she has any new or worsening signs or symptoms or any changes.  She verbalized understanding and agreement to today's plan had no further questions or concerns.
